# Patient Record
Sex: Male | Born: 1945 | Race: White | Hispanic: No | Marital: Married | Smoking: Former smoker
Health system: Southern US, Community
[De-identification: ages and names within clinical notes are randomized; demographics above are authoritative.]

## PROBLEM LIST (undated history)

## (undated) DIAGNOSIS — I10 Essential (primary) hypertension: Secondary | ICD-10-CM

## (undated) DIAGNOSIS — N400 Enlarged prostate without lower urinary tract symptoms: Secondary | ICD-10-CM

## (undated) HISTORY — PX: KNEE SURGERY: SHX244

## (undated) HISTORY — PX: TONSILLECTOMY: SUR1361

## (undated) HISTORY — PX: DG ELBOW RIGHT COMPLETE (ARMC HX): HXRAD1501

## (undated) HISTORY — PX: JOINT REPLACEMENT: SHX530

---

## 2003-05-01 ENCOUNTER — Ambulatory Visit (HOSPITAL_COMMUNITY): Admission: RE | Admit: 2003-05-01 | Discharge: 2003-05-01 | Payer: Self-pay | Admitting: Gastroenterology

## 2009-02-13 ENCOUNTER — Encounter: Admission: RE | Admit: 2009-02-13 | Discharge: 2009-02-13 | Payer: Self-pay | Admitting: Family Medicine

## 2009-03-12 ENCOUNTER — Encounter: Admission: RE | Admit: 2009-03-12 | Discharge: 2009-03-12 | Payer: Self-pay | Admitting: General Practice

## 2009-12-12 ENCOUNTER — Inpatient Hospital Stay (HOSPITAL_COMMUNITY): Admission: RE | Admit: 2009-12-12 | Discharge: 2009-12-14 | Payer: Self-pay | Admitting: Orthopedic Surgery

## 2010-09-21 LAB — BASIC METABOLIC PANEL
BUN: 11 mg/dL (ref 6–23)
BUN: 12 mg/dL (ref 6–23)
BUN: 16 mg/dL (ref 6–23)
CO2: 29 mEq/L (ref 19–32)
CO2: 31 mEq/L (ref 19–32)
Chloride: 101 mEq/L (ref 96–112)
Chloride: 104 mEq/L (ref 96–112)
Creatinine, Ser: 0.92 mg/dL (ref 0.4–1.5)
Creatinine, Ser: 1.11 mg/dL (ref 0.4–1.5)
GFR calc Af Amer: >60 mL/min (ref >60)
GFR calc non Af Amer: >60 mL/min (ref >60)
Glucose, Bld: 105 mg/dL — ABNORMAL HIGH (ref 70–99)
Potassium: 3.2 mEq/L — ABNORMAL LOW (ref 3.5–5.1)
Potassium: 3.6 mEq/L (ref 3.5–5.1)
Potassium: 3.8 mEq/L (ref 3.5–5.1)
Sodium: 136 mEq/L (ref 135–145)
Sodium: 142 mEq/L (ref 135–145)

## 2010-09-21 LAB — URINALYSIS, ROUTINE W REFLEX MICROSCOPIC
Bilirubin Urine: NEGATIVE
Ketones, ur: NEGATIVE mg/dL
Protein, ur: NEGATIVE mg/dL
Urobilinogen, UA: 0.2 mg/dL (ref 0.0–1.0)

## 2010-09-21 LAB — URINE MICROSCOPIC-ADD ON

## 2010-09-21 LAB — CBC
HCT: 39.9 % (ref 39.0–52.0)
HCT: 40.5 % (ref 39.0–52.0)
HCT: 48.2 % (ref 39.0–52.0)
Hemoglobin: 13.5 g/dL (ref 13.0–17.0)
Hemoglobin: 13.8 g/dL (ref 13.0–17.0)
Hemoglobin: 15.9 g/dL (ref 13.0–17.0)
MCHC: 34 g/dL (ref 30.0–36.0)
MCV: 90.5 fL (ref 78.0–100.0)
MCV: 90.6 fL (ref 78.0–100.0)
Platelets: 187 10*3/uL (ref 150–400)
Platelets: 204 10*3/uL (ref 150–400)
RBC: 4.41 MIL/uL (ref 4.22–5.81)
RBC: 5.32 MIL/uL (ref 4.22–5.81)
RDW: 14.1 % (ref 11.5–15.5)
RDW: 14.1 % (ref 11.5–15.5)
RDW: 14.4 % (ref 11.5–15.5)
WBC: 11.7 10*3/uL — ABNORMAL HIGH (ref 4.0–10.5)
WBC: 7.4 10*3/uL (ref 4.0–10.5)
WBC: 9.9 10*3/uL (ref 4.0–10.5)

## 2010-09-21 LAB — CARDIAC PANEL(CRET KIN+CKTOT+MB+TROPI): Total CK: 125 U/L (ref 7–232)

## 2010-09-21 LAB — SURGICAL PCR SCREEN: Staphylococcus aureus: POSITIVE — AB

## 2010-09-21 LAB — PROTIME-INR
INR: 0.98 (ref 0.00–1.49)
Prothrombin Time: 12.9 seconds (ref 11.6–15.2)

## 2010-09-21 LAB — DIFFERENTIAL
Basophils Absolute: 0 10*3/uL (ref 0.0–0.1)
Eosinophils Absolute: 0.2 10*3/uL (ref 0.0–0.7)
Lymphocytes Relative: 26 % (ref 12–46)
Monocytes Absolute: 0.6 10*3/uL (ref 0.1–1.0)

## 2010-09-21 LAB — APTT: aPTT: 30 seconds (ref 24–37)

## 2010-09-21 LAB — TYPE AND SCREEN

## 2010-11-20 NOTE — Op Note (Signed)
NAME:  Larry Kennedy, Larry Kennedy                            ACCOUNT NO.:  [id number]   MEDICAL RECORD NO.:  [id number]                   PATIENT TYPE:  AMB   LOCATION:  ENDO                                 FACILITY:  [facility]   PHYSICIAN:  Anselmo Rod, M.D.               DATE OF BIRTH:  Sep 17, 1945   DATE OF PROCEDURE:  05/01/2003  DATE OF DISCHARGE:                                 OPERATIVE REPORT   PROCEDURE:  Screening colonoscopy.   ENDOSCOPIST:  Anselmo Rod, M.D.   INSTRUMENT USED:  Olympus video colonoscope.   INDICATIONS FOR PROCEDURE:  A 65 year old white male underwent screening  colonoscopy to rule out colonic polyps, masses, etc.  The patient has  history of chronic constipation and rectal bleeding occasionally.  On  physical examination, he was found to have guaiac positive stools in the  office.   PREPROCEDURE PREPARATION:  Informed consent was procured from the patient.  The patient was fasted for eight hours prior to the procedure and prepped  with a bottle of magnesium citrate and a gallon of GoLYTELY the night prior  to the procedure.   PREPROCEDURE PHYSICAL EXAMINATION:  VITAL SIGNS: Stable.  NECK:  Supple.  CHEST:  Clear to auscultation.  S1 and S2 regular.  ABDOMEN:  Soft with normal bowel sounds.   DESCRIPTION OF PROCEDURE:  The patient was placed in the left lateral  decubitus position and sedated with 90 mg of Demerol and 9 mg of Versed in  slow incremental doses.  Once the patient was adequately sedated and  maintained on low flow oxygen and continuous cardiac monitoring, the Olympus  video colonoscope was advanced from the rectum to the cecum.  The  appendiceal orifice and ileocecal valve were clearly visualized and  photographed.  No masses, polyps, erosions, ulcerations, or diverticula were  seen.  Small internal hemorrhoids were appreciated on retroflexion in the  rectum. The patient tolerated the procedure well without complications.   IMPRESSION:  1.  Normal colonoscopy up to the cecum except for small internal hemorrhoids.  2. No masses, polyps, or diverticulosis seen.    RECOMMENDATIONS:  A high fiber diet with liberal fluid intake has been  advised.  Repeat CRC screening is recommended in the next 10 years unless  the patient develops any abnormal symptoms in the interim.  Outpatient  follow-up in the next two to three weeks for repeat guaiac.                                               Anselmo Rod, M.D.    JNM/MEDQ  D:  05/01/2003  T:  05/01/2003  Job:  [id number]   cc:   Marjory Lies, M.D.  [address]  [place]  [place] [address]  Fax:  [phone]  

## 2013-12-30 ENCOUNTER — Encounter (HOSPITAL_COMMUNITY): Payer: Self-pay | Admitting: Emergency Medicine

## 2013-12-30 ENCOUNTER — Emergency Department (HOSPITAL_COMMUNITY): Payer: Medicare Other

## 2013-12-30 ENCOUNTER — Emergency Department (HOSPITAL_COMMUNITY)
Admission: EM | Admit: 2013-12-30 | Discharge: 2013-12-30 | Disposition: A | Discharge status: Home / Self Care | Payer: Medicare Other | Attending: Emergency Medicine | Admitting: Emergency Medicine

## 2013-12-30 DIAGNOSIS — K560 Paralytic ileus: Secondary | ICD-10-CM | POA: Insufficient documentation

## 2013-12-30 DIAGNOSIS — R1032 Left lower quadrant pain: Secondary | ICD-10-CM

## 2013-12-30 DIAGNOSIS — K567 Ileus, unspecified: Secondary | ICD-10-CM

## 2013-12-30 HISTORY — DX: Essential (primary) hypertension: I10

## 2013-12-30 LAB — CBC WITH DIFFERENTIAL/PLATELET
BASOS ABS: 0 10*3/uL (ref 0.0–0.1)
BASOS PCT: 0 % (ref 0–1)
EOS PCT: 1 % (ref 0–5)
Eosinophils Absolute: 0 10*3/uL (ref 0.0–0.7)
HEMATOCRIT: 44 % (ref 39.0–52.0)
HEMOGLOBIN: 16 g/dL (ref 13.0–17.0)
Lymphocytes Relative: 11 % — ABNORMAL LOW (ref 12–46)
Lymphs Abs: 1 10*3/uL (ref 0.7–4.0)
MCH: 31.1 pg (ref 26.0–34.0)
MCHC: 36.4 g/dL — AB (ref 30.0–36.0)
MCV: 85.4 fL (ref 78.0–100.0)
MONO ABS: 0.3 10*3/uL (ref 0.1–1.0)
MONOS PCT: 4 % (ref 3–12)
NEUTROS ABS: 7.4 10*3/uL (ref 1.7–7.7)
NEUTROS PCT: 84 % — AB (ref 43–77)
PLATELETS: 194 10*3/uL (ref 150–400)
RBC: 5.15 MIL/uL (ref 4.22–5.81)
RDW: 12.8 % (ref 11.5–15.5)
WBC: 8.7 10*3/uL (ref 4.0–10.5)

## 2013-12-30 LAB — I-STAT CG4 LACTIC ACID, ED
LACTIC ACID, VENOUS: 3.17 mmol/L — AB (ref 0.5–2.2)
Lactic Acid, Venous: 2.13 mmol/L (ref 0.5–2.2)

## 2013-12-30 LAB — COMPREHENSIVE METABOLIC PANEL
ALBUMIN: 3.3 g/dL — AB (ref 3.5–5.2)
ALT: 27 U/L (ref 0–53)
AST: 22 U/L (ref 0–37)
Alkaline Phosphatase: 65 U/L (ref 39–117)
BILIRUBIN TOTAL: 0.5 mg/dL (ref 0.3–1.2)
BUN: 15 mg/dL (ref 6–23)
CO2: 26 mEq/L (ref 19–32)
CREATININE: 1.04 mg/dL (ref 0.50–1.35)
Calcium: 9.2 mg/dL (ref 8.4–10.5)
Chloride: 98 mEq/L (ref 96–112)
GFR calc Af Amer: 83 mL/min — ABNORMAL LOW (ref >90)
GFR calc non Af Amer: 72 mL/min — ABNORMAL LOW (ref >90)
GLUCOSE: 177 mg/dL — AB (ref 70–99)
POTASSIUM: 3.6 meq/L — AB (ref 3.7–5.3)
Sodium: 140 mEq/L (ref 137–147)
Total Protein: 7.2 g/dL (ref 6.0–8.3)

## 2013-12-30 LAB — URINALYSIS, ROUTINE W REFLEX MICROSCOPIC
BILIRUBIN URINE: NEGATIVE
GLUCOSE, UA: NEGATIVE mg/dL
Hgb urine dipstick: NEGATIVE
KETONES UR: NEGATIVE mg/dL
Leukocytes, UA: NEGATIVE
NITRITE: NEGATIVE
Protein, ur: NEGATIVE mg/dL
Specific Gravity, Urine: >1.046 — ABNORMAL HIGH (ref 1.005–1.030)
Urobilinogen, UA: 0.2 mg/dL (ref 0.0–1.0)
pH: 7 (ref 5.0–8.0)

## 2013-12-30 LAB — I-STAT TROPONIN, ED: Troponin i, poc: 0 ng/mL (ref 0.00–0.08)

## 2013-12-30 LAB — LIPASE, BLOOD: Lipase: 35 U/L (ref 11–59)

## 2013-12-30 MED ORDER — MORPHINE SULFATE 4 MG/ML IJ SOLN
4.0000 mg | Freq: Once | INTRAMUSCULAR | Status: AC
  Administered 2013-12-30: 4 mg via INTRAVENOUS
  Filled 2013-12-30: qty 1

## 2013-12-30 MED ORDER — HYDROMORPHONE HCL PF 1 MG/ML IJ SOLN
1.0000 mg | Freq: Once | INTRAMUSCULAR | Status: AC
  Administered 2013-12-30: 1 mg via INTRAVENOUS
  Filled 2013-12-30: qty 1

## 2013-12-30 MED ORDER — IOHEXOL 300 MG/ML  SOLN
100.0000 mL | Freq: Once | INTRAMUSCULAR | Status: AC | PRN
  Administered 2013-12-30: 100 mL via INTRAVENOUS

## 2013-12-30 MED ORDER — OXYCODONE-ACETAMINOPHEN 5-325 MG PO TABS: 1.0000 | ORAL_TABLET | ORAL | Status: DC | PRN

## 2013-12-30 MED ORDER — ONDANSETRON HCL 4 MG PO TABS: 4.0000 mg | ORAL_TABLET | Freq: Four times a day (QID) | ORAL | Status: DC

## 2013-12-30 MED ORDER — IOHEXOL 300 MG/ML  SOLN
50.0000 mL | Freq: Once | INTRAMUSCULAR | Status: AC | PRN
  Administered 2013-12-30: 50 mL via ORAL

## 2013-12-30 MED ORDER — SODIUM CHLORIDE 0.9 % IV BOLUS (SEPSIS)
1000.0000 mL | Freq: Once | INTRAVENOUS | Status: AC
  Administered 2013-12-30: 1000 mL via INTRAVENOUS

## 2013-12-30 NOTE — ED Notes (Signed)
Larry Kennedy, a P.A. [facility] phones to tell us they are sending pt. Here via p.o.v. For abd. Pain.  He rec'd. Injections of Toradol 30mg  and Phenergan 25mg  I.M.

## 2013-12-30 NOTE — Discharge Instructions (Signed)
Take the prescribed medication as directed for symptom control.  Pain meds may cause constipation so may need to use stool softener (colace, miralax, dulcolax) temporarily to help regulate bowel movements. Follow-up with Dr. Collene Mares regarding renal cysts and questionable mass next to your colon that we discussed.  I have attached copies of lab and imaging reports for physician review. Return to the ED for new or worsening symptoms-- severe/uncontrollable/changing pain, high fever, severe vomiting, etc.

## 2013-12-30 NOTE — ED Notes (Signed)
Pt aware urine sample is needed, urinal given 

## 2013-12-30 NOTE — ED Notes (Signed)
Tech at bedside drawing lactic acid.

## 2013-12-30 NOTE — ED Provider Notes (Signed)
CSN: [id number]     Arrival date & time 12/30/13  1616 History   First MD Initiated Contact with Patient 12/30/13 1620     Chief Complaint  Patient presents with  . Abdominal Pain     (Consider location/radiation/quality/duration/timing/severity/associated sxs/prior Treatment) Patient is a 68 y.o. male presenting with abdominal pain. The history is provided by the patient and medical records.  Abdominal Pain Associated symptoms: diarrhea    This is a 68 year old male presenting to the ED from urgent care for abdominal pain. Patient states pain began early this morning and has progressively worsened throughout the day today. Pain is localized to his left lower quadrant, described as an intense stabbing pain. He denies associated nausea or vomiting.  He does admit that his stools have been loose lately, but denies melena or hematochezia. Last occurrence was yesterday.  He denies recent travel or antibiotic use.  He denies any fever or chills. No urinary symptoms. There are history of kidney stones. Patient has had a colonoscopy twice in the past, most recent one in January 2015 with a few polyps removed but no history of diverticulosis.  No prior abdominal surgeries.  Patient states since arriving in the ED, he has developed somewhat of a burning sensation in his upper abdomen.  Denies chest pain, SOB, palpitations, dizziness, weakness.  No prior cardiac hx.  VS stable on arrival. GI-- [facility]  No past medical history on file. No past surgical history on file. No family history on file. History  Substance Use Topics  . Smoking status: Not on file  . Smokeless tobacco: Not on file  . Alcohol Use: Not on file    Review of Systems  Gastrointestinal: Positive for abdominal pain and diarrhea.  All other systems reviewed and are negative.     Allergies  Review of patient's allergies indicates not on file.  Home Medications   Prior to Admission medications   Not on File   BP 123/66   Pulse 57  Temp(Src) 97.6 F (36.4 C) (Oral)  Resp 18  SpO2 99%  Physical Exam  Nursing note and vitals reviewed. Constitutional: He is oriented to person, place, and time. He appears well-developed and well-nourished. No distress.  Uncomfortable appearing  HENT:  Head: Normocephalic and atraumatic.  Mouth/Throat: Oropharynx is clear and moist.  Eyes: Conjunctivae and EOM are normal. Pupils are equal, round, and reactive to light.  Neck: Normal range of motion. Neck supple.  Cardiovascular: Normal rate, regular rhythm and normal heart sounds.   Pulmonary/Chest: Effort normal and breath sounds normal. No respiratory distress. He has no wheezes.  Abdominal: Soft. Bowel sounds are normal. There is tenderness in the left lower quadrant. There is guarding. There is no rigidity and no CVA tenderness.  Abdomen soft, nondistended, tenderness and left lower quadrant with voluntary guarding, no rebound or peritoneal signs  Musculoskeletal: Normal range of motion.  Neurological: He is alert and oriented to person, place, and time.  Skin: Skin is warm and dry. He is not diaphoretic.  Psychiatric: He has a normal mood and affect.    ED Course  Procedures (including critical care time) Labs Review Labs Reviewed  CBC WITH DIFFERENTIAL - Abnormal; Notable for the following:    MCHC 36.4 (*)    Neutrophils Relative % 84 (*)    Lymphocytes Relative 11 (*)    All other components within normal limits  COMPREHENSIVE METABOLIC PANEL - Abnormal; Notable for the following:    Potassium 3.6 (*)  Glucose, Bld 177 (*)    Albumin 3.3 (*)    GFR calc non Af Amer 72 (*)    GFR calc Af Amer 83 (*)    All other components within normal limits  URINALYSIS, ROUTINE W REFLEX MICROSCOPIC - Abnormal; Notable for the following:    Specific Gravity, Urine >1.046 (*)    All other components within normal limits  I-STAT CG4 LACTIC ACID, ED - Abnormal; Notable for the following:    Lactic Acid, Venous 3.17 (*)     All other components within normal limits  LIPASE, BLOOD  I-STAT TROPOININ, ED  I-STAT CG4 LACTIC ACID, ED    Imaging Review Ct Abdomen Pelvis W Contrast  12/30/2013   CLINICAL DATA:  Left lower quadrant pain.  Sudden onset.  EXAM: CT ABDOMEN AND PELVIS WITH CONTRAST  TECHNIQUE: Multidetector CT imaging of the abdomen and pelvis was performed using the standard protocol following bolus administration of intravenous contrast.  CONTRAST:  2mL OMNIPAQUE IOHEXOL 300 MG/ML SOLN, 116mL OMNIPAQUE IOHEXOL 300 MG/ML SOLN  COMPARISON:  None.  FINDINGS: Visualization of the lower thorax demonstrates minimal dependent atelectasis. Normal heart size.  Liver is normal in size and contour without focal hepatic lesion identified. Mild central intrahepatic biliary ductal dilatation. The common bile duct is dilated measuring up to 11 mm. Gallbladder is grossly unremarkable. Portal vein is patent. The spleen, pancreas bilateral adrenal glands are unremarkable. Kidneys enhance symmetrically with contrast. There is a 1.3 cm low-attenuation lesion within the superior pole of the right kidney (image 29; series 2). Suggestion of possible internal septations.  Normal caliber abdominal aorta. No retroperitoneal lymphadenopathy. There is a 1.4 cm soft tissue mass adjacent to sigmoid colon (image 59; series 2), potentially representing an enlarged mesenteric lymph node. Urinary bladder is unremarkable. Central dystrophic calcifications in the prostate  The colon is nondistended. The appendix is normal. There a few prominent loops of small bowel within the left upper quadrant measuring up to 3.5 cm, slightly above that of the upper limits of normal. These taper appropriately within the central abdomen. No free fluid or free intraperitoneal air.  No aggressive or acute appearing osseous lesions. Lower lumbar spine degenerative change.  IMPRESSION: 1. There is a 1.4 cm soft tissue mass adjacent to the sigmoid colon which is  nonspecific however may be an enlarged mesenteric lymph node. In the absence of known malignancy, recommend follow-up CT in 3 months. Recommend further evaluation of the adjacent colon with colonoscopy if not previously performed. 2. Cystic lesion within superior aspect of the right kidney with possible internal septations. Recommend correlation with MRI or pre and postcontrast CT in the non acute setting for definitive characterization and to exclude septal enhancement. 3. Common bile duct measures up to 1.2 cm with mild central intrahepatic biliary ductal dilatation. This appears to taper normally near the level of the distal common bile duct. Recommend correlation with laboratory analysis. 4. There are a few mildly dilated loops of small bowel within left upper quadrant which appear to taper normally potentially secondary to focal enteritis or ileus.   Electronically Signed   By: Lovey Newcomer M.D.   On: 12/30/2013 18:52     EKG Interpretation None      MDM   Final diagnoses:  Ileus  Left lower quadrant pain   68 year old male with sudden onset of severe abdominal pain worsening throughout the day today.  On exam patient is afebrile and overall nontoxic appearing, although he does appear quite uncomfortable. He  has tenderness in his left lower quadrant with voluntary guarding but no peritoneal signs. Concern for acute surgical abdomen including diverticulitis, ischemic bowel. We'll plan for basic labs, lactic acid, U/a and CT scan for further eval.  Pain meds given.  EKG NSR without ischemic change.  Trop negative.  Labs as above-- no leukocytosis but lactate mildly elevated at 3.17.  IVFB given.  CT abdomen/pelvis with mildly dilated loops of small bowel consistent with ileus.  Multiple incidental findings also noted-- 1.4cm mass adjacent to sigmoid colon (questionable reactive lymph node), cystic lesion in right kidney, mild central biliary ductal dilatation.  Pts LFT's and bili WNL.  After pain  meds, pt has significantly improved.  Pt has tolerated PO solids and liquids in the ED without difficulty.  He has ambulated to and from the bathroom as well.  Repeat lactic acid after fluids WNL.  Pt remains afebrile and non-toxic appearing, VS stable, states he feels well enough to return home.  Encouraged small meals for the next few days, plenty of fluids.  Incidental findings on CT scan were discussed at length with patient and wife who both acknowledged understanding-- will FU with GI (Dr. Collene Mares) regarding these findings to have OP scans arranged.  Rx percocet and zofran for home-- recommended adding stool softener if needed for constipation issues.  Discussed plan with patient, he/she acknowledged understanding and agreed with plan of care.  Return precautions given for new or worsening symptoms.  Discussed with Dr. Venora Maples who agrees with assessment and plan of care.  Larene Pickett, PA-C 12/30/13 2340

## 2013-12-30 NOTE — ED Notes (Signed)
Pt sent from urgent care with c/o sudden onset of severe abdominal pain since this morning.

## 2013-12-31 NOTE — ED Provider Notes (Addendum)
Medical screening examination/treatment/procedure(s) were performed by non-physician practitioner and as supervising physician I was immediately available for consultation/collaboration.  ECG interpretation   Date: 12/31/2013  Rate: 58  Rhythm: normal sinus rhythm  QRS Axis: normal  Intervals: normal  ST/T Wave abnormalities: Nonspecific ST and T wave changes  Conduction Disutrbances: none  Narrative Interpretation:   Old EKG Reviewed: No significant changes noted       Hoy Morn, MD 12/31/13 [place], MD 12/31/13 [phone]

## 2014-01-24 ENCOUNTER — Other Ambulatory Visit: Payer: Self-pay | Admitting: Urology

## 2014-01-24 DIAGNOSIS — N281 Cyst of kidney, acquired: Secondary | ICD-10-CM

## 2014-02-04 ENCOUNTER — Ambulatory Visit
Admission: RE | Admit: 2014-02-04 | Discharge: 2014-02-04 | Disposition: A | Discharge status: Home / Self Care | Payer: Medicare Other | Source: Ambulatory Visit | Attending: Urology | Admitting: Urology

## 2014-02-04 DIAGNOSIS — N281 Cyst of kidney, acquired: Secondary | ICD-10-CM

## 2014-02-04 MED ORDER — GADOBENATE DIMEGLUMINE 529 MG/ML IV SOLN
20.0000 mL | Freq: Once | INTRAVENOUS | Status: AC | PRN
  Administered 2014-02-04: 20 mL via INTRAVENOUS

## 2017-01-14 ENCOUNTER — Ambulatory Visit: Payer: Self-pay | Admitting: Physician Assistant

## 2017-02-02 NOTE — Pre-Procedure Instructions (Signed)
[facility]  02/02/2017      [facility] [id number], [place] [address] [place] [address] Phone: [phone] Fax: [phone] [place], [place] [facility] [address] [place] [address] Phone: [phone] Fax: [phone]    Your procedure is scheduled on :  Wednesday, August 8th   Report to [facility] Admitting at 6:30 AM            (posted surgery time 8:30 - 11:30AM)   Call this number if you have problems the morning of surgery:  [phone], for any other questions, call [phone] Mon-Fri from 8a-4p   Remember:  Do not eat food or drink liquids after midnight Tuesday.              4-5 days prior to surgery, ONLY STOP TAKING any vitamins, herbal supplements, anti-inflammatories   Take these medicines the morning of surgery with A SIP OF WATER : Norvasc, Coreg   Do not wear jewelry - no rings or watches.  Do not wear lotions, colognes or deoderant.             Men may shave face and neck.   Do not bring valuables to the hospital.  [facility] is not responsible for any belongings or valuables.  Contacts, dentures or bridgework may not be worn into surgery.  Leave your suitcase in the car.  After surgery it may be brought to your room.  For patients admitted to the hospital, discharge time will be determined by your treatment team.  Please read over the following fact sheets that you were given. Pain Booklet, MRSA Information and Surgical Site Infection Prevention      [facility]- Preparing For Surgery  Before surgery, you can play an important role. Because skin is not sterile, your skin needs to be as free of germs as possible. You can reduce the number of germs on your skin by washing with CHG (chlorahexidine gluconate) Soap before surgery.  CHG is an antiseptic cleaner which kills germs and bonds with the skin to continue killing germs even after  washing.  Please do not use if you have an allergy to CHG or antibacterial soaps. If your skin becomes reddened/irritated stop using the CHG.  Do not shave (including legs and underarms) for at least 48 hours prior to first CHG shower. It is OK to shave your face.  Please follow these instructions carefully.   1. Shower the NIGHT BEFORE SURGERY and the MORNING OF SURGERY with CHG.   2. If you chose to wash your hair, wash your hair first as usual with your normal shampoo.  3. After you shampoo, rinse your hair and body thoroughly to remove the shampoo.  4. Use CHG as you would any other liquid soap. You can apply CHG directly to the skin and wash gently with a scrungie or a clean washcloth.   5. Apply the CHG Soap to your body ONLY FROM THE NECK DOWN.  Do not use on open wounds or open sores. Avoid contact with your eyes, ears, mouth and genitals (private parts). Wash genitals (private parts) with your normal soap.  6. Wash thoroughly, paying special attention to the area where your surgery will be performed.  7. Thoroughly rinse your body with warm water from the neck down.  8. DO NOT shower/wash with your normal soap after using and rinsing off the CHG  Soap.  9. Pat yourself dry with a CLEAN TOWEL.   10. Wear CLEAN PAJAMAS   11. Place CLEAN SHEETS on your bed the night of your first shower and DO NOT SLEEP WITH PETS.    Day of Surgery: Do not apply any deodorants/lotions. Please wear clean clothes to the hospital/surgery center.

## 2017-02-03 ENCOUNTER — Encounter (HOSPITAL_COMMUNITY): Payer: Self-pay

## 2017-02-03 ENCOUNTER — Encounter (HOSPITAL_COMMUNITY)
Admission: RE | Admit: 2017-02-03 | Discharge: 2017-02-03 | Disposition: A | Discharge status: Home / Self Care | Payer: Medicare Other | Source: Ambulatory Visit | Attending: Orthopedic Surgery | Admitting: Orthopedic Surgery

## 2017-02-03 DIAGNOSIS — Z01818 Encounter for other preprocedural examination: Secondary | ICD-10-CM | POA: Insufficient documentation

## 2017-02-03 DIAGNOSIS — I10 Essential (primary) hypertension: Secondary | ICD-10-CM | POA: Diagnosis not present

## 2017-02-03 HISTORY — DX: Benign prostatic hyperplasia without lower urinary tract symptoms: N40.0

## 2017-02-03 LAB — BASIC METABOLIC PANEL
ANION GAP: 8 (ref 5–15)
BUN: 13 mg/dL (ref 6–20)
CHLORIDE: 106 mmol/L (ref 101–111)
CO2: 25 mmol/L (ref 22–32)
Calcium: 8.8 mg/dL — ABNORMAL LOW (ref 8.9–10.3)
Creatinine, Ser: 0.97 mg/dL (ref 0.61–1.24)
GFR calc Af Amer: >60 mL/min (ref >60)
GLUCOSE: 140 mg/dL — AB (ref 65–99)
POTASSIUM: 3.6 mmol/L (ref 3.5–5.1)
Sodium: 139 mmol/L (ref 135–145)

## 2017-02-03 LAB — CBC
HEMATOCRIT: 44.4 % (ref 39.0–52.0)
Hemoglobin: 15.2 g/dL (ref 13.0–17.0)
MCH: 30.1 pg (ref 26.0–34.0)
MCHC: 34.2 g/dL (ref 30.0–36.0)
MCV: 87.9 fL (ref 78.0–100.0)
Platelets: 183 10*3/uL (ref 150–400)
RBC: 5.05 MIL/uL (ref 4.22–5.81)
RDW: 13.6 % (ref 11.5–15.5)
WBC: 6.7 10*3/uL (ref 4.0–10.5)

## 2017-02-03 LAB — SURGICAL PCR SCREEN
MRSA, PCR: NEGATIVE
Staphylococcus aureus: NEGATIVE

## 2017-02-03 NOTE — Progress Notes (Signed)
   02/03/17 0903  OBSTRUCTIVE SLEEP APNEA  Have you ever been diagnosed with sleep apnea through a sleep study? No  Do you snore loudly (loud enough to be heard through closed doors)?  1  Do you often feel tired, fatigued, or sleepy during the daytime (such as falling asleep during driving or talking to someone)? 0  Has anyone observed you stop breathing during your sleep? 0  Do you have, or are you being treated for high blood pressure? 1  BMI more than 35 kg/m2? 0  Age > 50 (1-yes) 1  Neck circumference greater than:Male 16 inches or larger, Male 17inches or larger? 1  Male Gender (Yes=1) 1  Obstructive Sleep Apnea Score 5  Score 5 or greater  Results sent to PCP

## 2017-02-03 NOTE — Progress Notes (Addendum)
Pre op clearance note in Epic from [facility] Requesting EKG tracing from the same. PCP is Dr. Percival Spanish  LOV 01/2017 Denies any cardiac issues, no murmur, stroke, cp.  No cardiac tests done

## 2017-02-09 ENCOUNTER — Inpatient Hospital Stay (HOSPITAL_COMMUNITY)
Admission: AD | Admit: 2017-02-09 | Discharge: 2017-02-11 | DRG: 520 | Disposition: A | Discharge status: Home / Self Care | Payer: Medicare Other | Source: Ambulatory Visit | Attending: Orthopedic Surgery | Admitting: Orthopedic Surgery

## 2017-02-09 ENCOUNTER — Inpatient Hospital Stay (HOSPITAL_COMMUNITY): Payer: Medicare Other | Admitting: Certified Registered"

## 2017-02-09 ENCOUNTER — Inpatient Hospital Stay (HOSPITAL_COMMUNITY): Payer: Medicare Other

## 2017-02-09 ENCOUNTER — Encounter (HOSPITAL_COMMUNITY): Payer: Self-pay | Admitting: Orthopedic Surgery

## 2017-02-09 ENCOUNTER — Encounter (HOSPITAL_COMMUNITY)
Admission: AD | Disposition: A | Discharge status: Home / Self Care | Payer: Self-pay | Source: Ambulatory Visit | Attending: Orthopedic Surgery

## 2017-02-09 DIAGNOSIS — I10 Essential (primary) hypertension: Secondary | ICD-10-CM | POA: Diagnosis present

## 2017-02-09 DIAGNOSIS — Z79899 Other long term (current) drug therapy: Secondary | ICD-10-CM

## 2017-02-09 DIAGNOSIS — M5127 Other intervertebral disc displacement, lumbosacral region: Secondary | ICD-10-CM | POA: Diagnosis present

## 2017-02-09 DIAGNOSIS — M5126 Other intervertebral disc displacement, lumbar region: Secondary | ICD-10-CM | POA: Diagnosis present

## 2017-02-09 DIAGNOSIS — Z9889 Other specified postprocedural states: Secondary | ICD-10-CM

## 2017-02-09 DIAGNOSIS — M4687 Other specified inflammatory spondylopathies, lumbosacral region: Secondary | ICD-10-CM | POA: Diagnosis not present

## 2017-02-09 DIAGNOSIS — Z419 Encounter for procedure for purposes other than remedying health state, unspecified: Secondary | ICD-10-CM

## 2017-02-09 DIAGNOSIS — N400 Enlarged prostate without lower urinary tract symptoms: Secondary | ICD-10-CM | POA: Diagnosis present

## 2017-02-09 DIAGNOSIS — Z87891 Personal history of nicotine dependence: Secondary | ICD-10-CM

## 2017-02-09 DIAGNOSIS — G8929 Other chronic pain: Secondary | ICD-10-CM | POA: Diagnosis not present

## 2017-02-09 DIAGNOSIS — M48062 Spinal stenosis, lumbar region with neurogenic claudication: Principal | ICD-10-CM | POA: Diagnosis present

## 2017-02-09 DIAGNOSIS — Z8249 Family history of ischemic heart disease and other diseases of the circulatory system: Secondary | ICD-10-CM | POA: Diagnosis not present

## 2017-02-09 DIAGNOSIS — M5136 Other intervertebral disc degeneration, lumbar region: Secondary | ICD-10-CM | POA: Diagnosis present

## 2017-02-09 HISTORY — PX: LUMBAR LAMINECTOMY/DECOMPRESSION MICRODISCECTOMY: SHX5026

## 2017-02-09 SURGERY — LUMBAR LAMINECTOMY/DECOMPRESSION MICRODISCECTOMY 3 LEVELS
Anesthesia: General | Site: Spine Lumbar

## 2017-02-09 MED ORDER — ONDANSETRON 4 MG PO TBDP: 4.0000 mg | ORAL_TABLET | Freq: Three times a day (TID) | ORAL | 0 refills | Status: DC | PRN

## 2017-02-09 MED ORDER — TAMSULOSIN HCL 0.4 MG PO CAPS
0.4000 mg | ORAL_CAPSULE | Freq: Every evening | ORAL | Status: DC
  Administered 2017-02-09: 0.4 mg via ORAL
  Filled 2017-02-09: qty 1

## 2017-02-09 MED ORDER — BUPIVACAINE-EPINEPHRINE (PF) 0.25% -1:200000 IJ SOLN
INTRAMUSCULAR | Status: AC
  Filled 2017-02-09: qty 30

## 2017-02-09 MED ORDER — OXYCODONE-ACETAMINOPHEN 10-325 MG PO TABS: 1.0000 | ORAL_TABLET | ORAL | 0 refills | Status: AC | PRN

## 2017-02-09 MED ORDER — SODIUM CHLORIDE 0.9 % IV SOLN: 250.0000 mL | INTRAVENOUS | Status: DC

## 2017-02-09 MED ORDER — BUPIVACAINE-EPINEPHRINE 0.25% -1:200000 IJ SOLN
INTRAMUSCULAR | Status: DC | PRN
  Administered 2017-02-09: 10 mL

## 2017-02-09 MED ORDER — PHENYLEPHRINE 40 MCG/ML (10ML) SYRINGE FOR IV PUSH (FOR BLOOD PRESSURE SUPPORT)
PREFILLED_SYRINGE | INTRAVENOUS | Status: DC | PRN
  Administered 2017-02-09: 80 ug via INTRAVENOUS
  Administered 2017-02-09: 160 ug via INTRAVENOUS
  Administered 2017-02-09 (×2): 80 ug via INTRAVENOUS

## 2017-02-09 MED ORDER — OXYCODONE HCL 5 MG PO TABS
5.0000 mg | ORAL_TABLET | ORAL | Status: DC | PRN
  Administered 2017-02-09 (×2): 10 mg via ORAL
  Administered 2017-02-10: 5 mg via ORAL
  Administered 2017-02-10 (×3): 10 mg via ORAL
  Filled 2017-02-09 (×4): qty 2
  Filled 2017-02-09: qty 1
  Filled 2017-02-09: qty 2

## 2017-02-09 MED ORDER — LIDOCAINE 2% (20 MG/ML) 5 ML SYRINGE
INTRAMUSCULAR | Status: AC
  Filled 2017-02-09: qty 5

## 2017-02-09 MED ORDER — ONDANSETRON HCL 4 MG PO TABS: 4.0000 mg | ORAL_TABLET | Freq: Four times a day (QID) | ORAL | Status: DC | PRN

## 2017-02-09 MED ORDER — LACTATED RINGERS IV SOLN
INTRAVENOUS | Status: DC
  Administered 2017-02-09 (×3): via INTRAVENOUS

## 2017-02-09 MED ORDER — ROCURONIUM BROMIDE 10 MG/ML (PF) SYRINGE
PREFILLED_SYRINGE | INTRAVENOUS | Status: AC
  Filled 2017-02-09: qty 5

## 2017-02-09 MED ORDER — MIDAZOLAM HCL 2 MG/2ML IJ SOLN
INTRAMUSCULAR | Status: AC
  Filled 2017-02-09: qty 2

## 2017-02-09 MED ORDER — FENTANYL CITRATE (PF) 250 MCG/5ML IJ SOLN
INTRAMUSCULAR | Status: DC | PRN
  Administered 2017-02-09 (×2): 50 ug via INTRAVENOUS
  Administered 2017-02-09 (×2): 25 ug via INTRAVENOUS

## 2017-02-09 MED ORDER — THROMBIN 20000 UNITS EX SOLR
CUTANEOUS | Status: AC
  Filled 2017-02-09: qty 20000

## 2017-02-09 MED ORDER — ONDANSETRON HCL 4 MG/2ML IJ SOLN
INTRAMUSCULAR | Status: AC
  Filled 2017-02-09: qty 2

## 2017-02-09 MED ORDER — ALBUMIN HUMAN 5 % IV SOLN
INTRAVENOUS | Status: DC | PRN
  Administered 2017-02-09: 13:00:00 via INTRAVENOUS

## 2017-02-09 MED ORDER — PROPOFOL 10 MG/ML IV BOLUS
INTRAVENOUS | Status: AC
  Filled 2017-02-09: qty 20

## 2017-02-09 MED ORDER — ATORVASTATIN CALCIUM 20 MG PO TABS
40.0000 mg | ORAL_TABLET | Freq: Every evening | ORAL | Status: DC
  Administered 2017-02-09: 40 mg via ORAL
  Filled 2017-02-09: qty 2

## 2017-02-09 MED ORDER — THROMBIN 20000 UNITS EX KIT
PACK | CUTANEOUS | Status: DC | PRN
  Administered 2017-02-09: 20 mL via TOPICAL

## 2017-02-09 MED ORDER — FLUTICASONE PROPIONATE 50 MCG/ACT NA SUSP: 1.0000 | Freq: Every day | NASAL | Status: DC | PRN

## 2017-02-09 MED ORDER — MORPHINE SULFATE (PF) 4 MG/ML IV SOLN: 1.0000 mg | INTRAVENOUS | Status: DC | PRN

## 2017-02-09 MED ORDER — PHENYLEPHRINE 40 MCG/ML (10ML) SYRINGE FOR IV PUSH (FOR BLOOD PRESSURE SUPPORT)
PREFILLED_SYRINGE | INTRAVENOUS | Status: AC
  Filled 2017-02-09: qty 10

## 2017-02-09 MED ORDER — BUPIVACAINE-EPINEPHRINE 0.25% -1:200000 IJ SOLN
INTRAMUSCULAR | Status: AC
  Filled 2017-02-09: qty 1

## 2017-02-09 MED ORDER — CEFAZOLIN SODIUM 1 G IJ SOLR
INTRAMUSCULAR | Status: AC
  Filled 2017-02-09: qty 20

## 2017-02-09 MED ORDER — ROCURONIUM BROMIDE 10 MG/ML (PF) SYRINGE
PREFILLED_SYRINGE | INTRAVENOUS | Status: DC | PRN
  Administered 2017-02-09: 10 mg via INTRAVENOUS
  Administered 2017-02-09: 60 mg via INTRAVENOUS
  Administered 2017-02-09 (×4): 20 mg via INTRAVENOUS

## 2017-02-09 MED ORDER — CARVEDILOL 6.25 MG PO TABS
18.7500 mg | ORAL_TABLET | Freq: Two times a day (BID) | ORAL | Status: DC
  Administered 2017-02-10: 18.75 mg via ORAL
  Filled 2017-02-09 (×2): qty 3

## 2017-02-09 MED ORDER — MENTHOL 3 MG MT LOZG: 1.0000 | LOZENGE | OROMUCOSAL | Status: DC | PRN

## 2017-02-09 MED ORDER — PHENOL 1.4 % MT LIQD: 1.0000 | OROMUCOSAL | Status: DC | PRN

## 2017-02-09 MED ORDER — MAGNESIUM CITRATE PO SOLN: 1.0000 | Freq: Once | ORAL | Status: DC | PRN

## 2017-02-09 MED ORDER — LIDOCAINE 2% (20 MG/ML) 5 ML SYRINGE
INTRAMUSCULAR | Status: DC | PRN
  Administered 2017-02-09: 100 mg via INTRAVENOUS

## 2017-02-09 MED ORDER — METOCLOPRAMIDE HCL 5 MG/ML IJ SOLN: 10.0000 mg | Freq: Once | INTRAMUSCULAR | Status: DC | PRN

## 2017-02-09 MED ORDER — DEXAMETHASONE SODIUM PHOSPHATE 10 MG/ML IJ SOLN
INTRAMUSCULAR | Status: DC | PRN
  Administered 2017-02-09: 10 mg via INTRAVENOUS

## 2017-02-09 MED ORDER — FENTANYL CITRATE (PF) 250 MCG/5ML IJ SOLN
INTRAMUSCULAR | Status: AC
  Filled 2017-02-09: qty 5

## 2017-02-09 MED ORDER — CYCLOSPORINE 0.05 % OP EMUL
1.0000 [drp] | Freq: Two times a day (BID) | OPHTHALMIC | Status: DC
  Administered 2017-02-09 – 2017-02-10 (×2): 1 [drp] via OPHTHALMIC
  Filled 2017-02-09 (×4): qty 1

## 2017-02-09 MED ORDER — MEPERIDINE HCL 25 MG/ML IJ SOLN: 6.2500 mg | INTRAMUSCULAR | Status: DC | PRN

## 2017-02-09 MED ORDER — SODIUM CHLORIDE 0.9% FLUSH: 3.0000 mL | INTRAVENOUS | Status: DC | PRN

## 2017-02-09 MED ORDER — METHOCARBAMOL 500 MG PO TABS
500.0000 mg | ORAL_TABLET | Freq: Four times a day (QID) | ORAL | Status: DC | PRN
  Administered 2017-02-09 – 2017-02-10 (×3): 500 mg via ORAL
  Filled 2017-02-09 (×3): qty 1

## 2017-02-09 MED ORDER — DEXAMETHASONE SODIUM PHOSPHATE 10 MG/ML IJ SOLN
INTRAMUSCULAR | Status: AC
  Filled 2017-02-09: qty 1

## 2017-02-09 MED ORDER — AMLODIPINE BESYLATE 5 MG PO TABS
5.0000 mg | ORAL_TABLET | Freq: Every day | ORAL | Status: DC
  Administered 2017-02-10: 5 mg via ORAL
  Filled 2017-02-09 (×2): qty 1

## 2017-02-09 MED ORDER — SUGAMMADEX SODIUM 200 MG/2ML IV SOLN
INTRAVENOUS | Status: DC | PRN
  Administered 2017-02-09: 200 mg via INTRAVENOUS

## 2017-02-09 MED ORDER — HEMOSTATIC AGENTS (NO CHARGE) OPTIME
TOPICAL | Status: DC | PRN
  Administered 2017-02-09 (×2): 1 via TOPICAL

## 2017-02-09 MED ORDER — SODIUM CHLORIDE 0.9% FLUSH: 3.0000 mL | Freq: Two times a day (BID) | INTRAVENOUS | Status: DC

## 2017-02-09 MED ORDER — METHOCARBAMOL 500 MG PO TABS: 500.0000 mg | ORAL_TABLET | Freq: Three times a day (TID) | ORAL | 0 refills | Status: AC

## 2017-02-09 MED ORDER — DOCUSATE SODIUM 100 MG PO CAPS
100.0000 mg | ORAL_CAPSULE | Freq: Two times a day (BID) | ORAL | Status: DC
  Administered 2017-02-09 – 2017-02-10 (×2): 100 mg via ORAL
  Filled 2017-02-09 (×2): qty 1

## 2017-02-09 MED ORDER — ONDANSETRON HCL 4 MG/2ML IJ SOLN: 4.0000 mg | Freq: Four times a day (QID) | INTRAMUSCULAR | Status: DC | PRN

## 2017-02-09 MED ORDER — PHENYLEPHRINE HCL 10 MG/ML IJ SOLN
INTRAVENOUS | Status: DC | PRN
  Administered 2017-02-09: 20 ug/min via INTRAVENOUS

## 2017-02-09 MED ORDER — CEFAZOLIN SODIUM-DEXTROSE 2-4 GM/100ML-% IV SOLN
2.0000 g | INTRAVENOUS | Status: AC
  Administered 2017-02-09 (×2): 2 g via INTRAVENOUS
  Filled 2017-02-09: qty 100

## 2017-02-09 MED ORDER — 0.9 % SODIUM CHLORIDE (POUR BTL) OPTIME
TOPICAL | Status: DC | PRN
  Administered 2017-02-09 (×3): 1000 mL

## 2017-02-09 MED ORDER — FENTANYL CITRATE (PF) 100 MCG/2ML IJ SOLN
25.0000 ug | INTRAMUSCULAR | Status: DC | PRN
  Administered 2017-02-09: 25 ug via INTRAVENOUS
  Administered 2017-02-09: 50 ug via INTRAVENOUS

## 2017-02-09 MED ORDER — ONDANSETRON HCL 4 MG/2ML IJ SOLN
INTRAMUSCULAR | Status: DC | PRN
  Administered 2017-02-09: 4 mg via INTRAVENOUS

## 2017-02-09 MED ORDER — CEFAZOLIN SODIUM-DEXTROSE 2-4 GM/100ML-% IV SOLN
2.0000 g | Freq: Three times a day (TID) | INTRAVENOUS | Status: AC
  Administered 2017-02-09 – 2017-02-10 (×2): 2 g via INTRAVENOUS
  Filled 2017-02-09 (×2): qty 100

## 2017-02-09 MED ORDER — THROMBIN 20000 UNITS EX SOLR: CUTANEOUS | Status: DC | PRN

## 2017-02-09 MED ORDER — EPHEDRINE 5 MG/ML INJ
INTRAVENOUS | Status: AC
  Filled 2017-02-09: qty 10

## 2017-02-09 MED ORDER — MIDAZOLAM HCL 5 MG/5ML IJ SOLN
INTRAMUSCULAR | Status: DC | PRN
  Administered 2017-02-09: 2 mg via INTRAVENOUS

## 2017-02-09 MED ORDER — FENTANYL CITRATE (PF) 100 MCG/2ML IJ SOLN
INTRAMUSCULAR | Status: AC
  Administered 2017-02-09: 50 ug via INTRAVENOUS
  Filled 2017-02-09: qty 2

## 2017-02-09 MED ORDER — POLYETHYLENE GLYCOL 3350 17 G PO PACK: 17.0000 g | PACK | Freq: Every day | ORAL | Status: DC | PRN

## 2017-02-09 MED ORDER — METHOCARBAMOL 1000 MG/10ML IJ SOLN: 500.0000 mg | Freq: Four times a day (QID) | INTRAVENOUS | Status: DC | PRN

## 2017-02-09 MED ORDER — SUGAMMADEX SODIUM 200 MG/2ML IV SOLN
INTRAVENOUS | Status: AC
  Filled 2017-02-09: qty 2

## 2017-02-09 MED ORDER — EPHEDRINE SULFATE-NACL 50-0.9 MG/10ML-% IV SOSY
PREFILLED_SYRINGE | INTRAVENOUS | Status: DC | PRN
  Administered 2017-02-09 (×5): 10 mg via INTRAVENOUS

## 2017-02-09 MED ORDER — PROPOFOL 10 MG/ML IV BOLUS
INTRAVENOUS | Status: DC | PRN
  Administered 2017-02-09: 200 mg via INTRAVENOUS
  Administered 2017-02-09: 40 mg via INTRAVENOUS

## 2017-02-09 MED ORDER — LACTATED RINGERS IV SOLN: INTRAVENOUS | Status: DC

## 2017-02-09 SURGICAL SUPPLY — 74 items
BUR EGG ELITE 4.0 (BURR) IMPLANT
BUR EGG ELITE 4.0MM (BURR)
BUR MATCHSTICK NEURO 3.0 LAGG (BURR) ×3 IMPLANT
CANISTER SUCT 3000ML PPV (MISCELLANEOUS) ×3 IMPLANT
CLOSURE STERI-STRIP 1/2X4 (GAUZE/BANDAGES/DRESSINGS) ×1
CLOSURE WOUND 1/2 X4 (GAUZE/BANDAGES/DRESSINGS) ×1
CLSR STERI-STRIP ANTIMIC 1/2X4 (GAUZE/BANDAGES/DRESSINGS) ×2 IMPLANT
CORDS BIPOLAR (ELECTRODE) ×3 IMPLANT
COVER SURGICAL LIGHT HANDLE (MISCELLANEOUS) ×3 IMPLANT
DERMABOND ADVANCED (GAUZE/BANDAGES/DRESSINGS) ×2
DERMABOND ADVANCED .7 DNX12 (GAUZE/BANDAGES/DRESSINGS) ×1 IMPLANT
DRAIN CHANNEL 15F RND FF W/TCR (WOUND CARE) ×3 IMPLANT
DRAPE POUCH INSTRU U-SHP 10X18 (DRAPES) ×3 IMPLANT
DRAPE SURG 17X23 STRL (DRAPES) ×3 IMPLANT
DRAPE U-SHAPE 47X51 STRL (DRAPES) ×3 IMPLANT
DRSG AQUACEL AG ADV 3.5X10 (GAUZE/BANDAGES/DRESSINGS) IMPLANT
DRSG OPSITE POSTOP 4X10 (GAUZE/BANDAGES/DRESSINGS) ×3 IMPLANT
DRSG OPSITE POSTOP 4X8 (GAUZE/BANDAGES/DRESSINGS) IMPLANT
DURAPREP 26ML APPLICATOR (WOUND CARE) ×3 IMPLANT
ELECT BLADE 4.0 EZ CLEAN MEGAD (MISCELLANEOUS)
ELECT CAUTERY BLADE 6.4 (BLADE) ×3 IMPLANT
ELECT PENCIL ROCKER SW 15FT (MISCELLANEOUS) ×3 IMPLANT
ELECT REM PT RETURN 9FT ADLT (ELECTROSURGICAL) ×3
ELECTRODE BLDE 4.0 EZ CLN MEGD (MISCELLANEOUS) IMPLANT
ELECTRODE REM PT RTRN 9FT ADLT (ELECTROSURGICAL) ×1 IMPLANT
EVACUATOR 1/8 PVC DRAIN (DRAIN) IMPLANT
EVACUATOR SILICONE 100CC (DRAIN) ×3 IMPLANT
GAUZE SPONGE 4X4 12PLY STRL (GAUZE/BANDAGES/DRESSINGS) ×3 IMPLANT
GLOVE BIO SURGEON STRL SZ 6.5 (GLOVE) ×4 IMPLANT
GLOVE BIO SURGEONS STRL SZ 6.5 (GLOVE) ×2
GLOVE BIOGEL PI IND STRL 6.5 (GLOVE) ×2 IMPLANT
GLOVE BIOGEL PI IND STRL 7.0 (GLOVE) ×1 IMPLANT
GLOVE BIOGEL PI IND STRL 8.5 (GLOVE) ×1 IMPLANT
GLOVE BIOGEL PI INDICATOR 6.5 (GLOVE) ×4
GLOVE BIOGEL PI INDICATOR 7.0 (GLOVE) ×2
GLOVE BIOGEL PI INDICATOR 8.5 (GLOVE) ×2
GLOVE SS BIOGEL STRL SZ 8.5 (GLOVE) ×1 IMPLANT
GLOVE SUPERSENSE BIOGEL SZ 8.5 (GLOVE) ×2
GLOVE SURG SS PI 6.5 STRL IVOR (GLOVE) ×6 IMPLANT
GOWN STRL REUS W/ TWL LRG LVL3 (GOWN DISPOSABLE) ×4 IMPLANT
GOWN STRL REUS W/TWL 2XL LVL3 (GOWN DISPOSABLE) ×3 IMPLANT
GOWN STRL REUS W/TWL LRG LVL3 (GOWN DISPOSABLE) ×8
KIT BASIN OR (CUSTOM PROCEDURE TRAY) ×3 IMPLANT
KIT ROOM TURNOVER OR (KITS) ×3 IMPLANT
NEEDLE 22X1 1/2 (OR ONLY) (NEEDLE) ×3 IMPLANT
NEEDLE SPNL 18GX3.5 QUINCKE PK (NEEDLE) ×6 IMPLANT
NS IRRIG 1000ML POUR BTL (IV SOLUTION) ×6 IMPLANT
PACK LAMINECTOMY ORTHO (CUSTOM PROCEDURE TRAY) ×3 IMPLANT
PACK UNIVERSAL I (CUSTOM PROCEDURE TRAY) ×3 IMPLANT
PAD ARMBOARD 7.5X6 YLW CONV (MISCELLANEOUS) ×6 IMPLANT
PATTIES SURGICAL .5 X.5 (GAUZE/BANDAGES/DRESSINGS) IMPLANT
PATTIES SURGICAL .5 X1 (DISPOSABLE) ×9 IMPLANT
SPONGE LAP 18X18 X RAY DECT (DISPOSABLE) ×3 IMPLANT
SPONGE LAP 4X18 X RAY DECT (DISPOSABLE) ×9 IMPLANT
SPONGE SURGIFOAM ABS GEL 100 (HEMOSTASIS) IMPLANT
STRIP CLOSURE SKIN 1/2X4 (GAUZE/BANDAGES/DRESSINGS) ×2 IMPLANT
SURGIFLO W/THROMBIN 8M KIT (HEMOSTASIS) ×3 IMPLANT
SUT BONE WAX W31G (SUTURE) ×3 IMPLANT
SUT MON AB 3-0 SH 27 (SUTURE) ×2
SUT MON AB 3-0 SH27 (SUTURE) ×1 IMPLANT
SUT SILK 2 0 SH (SUTURE) ×3 IMPLANT
SUT VIC AB 0 CT1 27 (SUTURE) ×2
SUT VIC AB 0 CT1 27XBRD ANBCTR (SUTURE) ×1 IMPLANT
SUT VIC AB 1 CT1 18XCR BRD 8 (SUTURE) ×2 IMPLANT
SUT VIC AB 1 CT1 8-18 (SUTURE) ×4
SUT VIC AB 1 CTX 36 (SUTURE) ×4
SUT VIC AB 1 CTX36XBRD ANBCTR (SUTURE) ×2 IMPLANT
SUT VIC AB 2-0 CT1 18 (SUTURE) ×3 IMPLANT
SYR BULB IRRIGATION 50ML (SYRINGE) ×3 IMPLANT
SYR CONTROL 10ML LL (SYRINGE) ×3 IMPLANT
TOWEL OR 17X24 6PK STRL BLUE (TOWEL DISPOSABLE) IMPLANT
TOWEL OR 17X26 10 PK STRL BLUE (TOWEL DISPOSABLE) IMPLANT
WATER STERILE IRR 1000ML POUR (IV SOLUTION) IMPLANT
YANKAUER SUCT BULB TIP NO VENT (SUCTIONS) ×3 IMPLANT

## 2017-02-09 NOTE — Op Note (Signed)
Operative note.  Preoperative diagnosis. Lumbar spinal stenosis. Lumbar disc herniation.  Postoperative diagnosis. Same.  Operative procedure. 1 posterior lumbar decompression L2-S1.    2. L5-S1 discectomy left side.  Complications. None.  First Environmental consultant. [facility].  Indications. 71 year old gentleman with severe back buttock and bilateral leg pain left side worse than the right. Imaging studies demonstrated severe spinal stenosis L 2/3  L3/4 L4/5 moderate spinal stenosis L5/S1 with left-sided disc herniation causing compression of the L5 and S1 nerve root. Due to failed conservative management we elected to proceed with surgery. All appropriate risks benefits and alternatives were discussed with the patient. Consent was obtained.  Operative note. Patient was brought the operating room placed by the operating table. After successful induction of general anesthesia and endotracheal intubation teds SCDs and a Foley were inserted. The patient was turned prone onto the Wilson frame and all bony prominences well-padded. The back was prepped and draped in a standard fashion. Timeout was taken confirming patient procedure and all other pertinent important data.  2 needles were then placed into the back and x-ray was taken for localization of the incision. I marked out the incision site and infiltrated with quarter percent Marcaine. Midline incision was made from the inferior aspect of L1 to the inferior aspect of S1. Sharp dissection was carried out down to the deep fascia. I incised the deep fascia and began stripping the paraspinal muscles using a Cobb and Bovie to expose the posterior lateral aspect posterior aspect of spine from L2-S1. Once I had this achieved I then placed a Penfield 4 underneath the L5 lamina and took a second x-ray. I confirm that I was at the appropriate level.  At this point I began my decompression. Using a double-action Leksell rongeur I removed the spinous processes of  L2-3 for and L5. I then used a Penfield 4 to develop a plane underneath the L5 lamina and performed a central laminotomy of L5. I then dissected through the thickened redundant ligamentum flavum and removed this with the Kerrison punch to expose the thecal sac I continue to walk work from a caudal to cranial fashion. I resected the entire L5 lamina and continued my decompression. There was significant spinal stenosis centrally at the 3/4 and 2/ 3 levels. Great care was taken using neuro curettes to develop a plane underneath the significant central bone spurs. Using 2 mm Kerrison punches Penfield 4 for dissection and Kerrison rongeurs for dissection elderly was able to resect the central lamina at L2 and L3 and L4. At this point I now had excellent visualization of the thecal sac. I then began working my way into the lateral recess. Beginning on the right hand side I used my 2 and 3 mm Kerrison punch to resect the medial aspect of the facet complex and sees osteophytes) until I could palpate and visualize the medial aspect of the pedicle. I continued my lateral recess and foraminal decompression using the Kerrison rongeurs starting at L2 and proceeding down to S1. Once I completed this on the right side I was able to easily pass my [facility] along the lateral recess and out each respective foramen. There was no significant neural compression at this point. I then went to the contralateral side and using my Kerrison rongeurs and gentle technique I was able to perform a lateral recess decompression similar to her performed on the contralateral side. Again I also performed foraminotomies using Kerrison punch to undercut this and adequately decompress the foramen. Again I  used my Surveyor, quantity to confirm an adequate posterior lateral decompression. The lateral recess was completely frayed and the nerve roots were easily palpable out into the foramen at each level. I then turned my attention to the L5-S1  left-sided disc. I gently dissected the S1 nerve root medially and identified the L5-S1 disc space were small fragments of disc material still remaining which I removed using a micropituitary rongeur. At this point the exiting L5 and traversing S1 nerve root were completely decompressed and no longer under neural tension. I took another sweep with my [facility] confirming I could visualize all the pedicles and I had a pedicle to pedicle decompression in the lateral recess and foramen were all decompressed. At this point using bipolar cautery I obtained hemostasis maintained it with FloSeal.  The wound was copiously irrigated with normal saline and the remaining FloSeal was removed. I did place a drain through a separate stab incision. At this point inspection of the thecal sac demonstrated no evidence of CSF leak and the compression was completely resolved. I then closed the deep fascia with interrupted #1 Vicryl sutures, superficial 2-0 Vicryl sutures, and a 3-0 Monocryl for the skin. The drain was sutured with a 2-0 silk. Dry dressing was applied and the patient was ultimately ultimately extubated and transferred to the PACU that incident. The end of the case all needle sponge counts were correct.

## 2017-02-09 NOTE — Brief Op Note (Signed)
02/09/2017  1:27 PM  PATIENT:  Elvera Lennox  71 y.o. male  PRE-OPERATIVE DIAGNOSIS:  Spinal stenosis and L5-S1 HNP  POST-OPERATIVE DIAGNOSIS:  Spinal stenosis and L5-S1 HNP  PROCEDURE:  Procedure(s) with comments: Lumbar decompression L2-S1 with L5-S1 disectomy  (N/A) - 3 h rs  SURGEON:  Surgeon(s) and Role:    [name], MD - Primary  PHYSICIAN ASSISTANT: Carman Mayo  ANESTHESIA:   general  EBL:  Total I/O In: 2250 [I.V.:2000; IV Piggyback:250] Out: 1110 [Urine:410; Blood:700]  BLOOD ADMINISTERED:none  DRAINS: 1 the back   LOCAL MEDICATIONS USED:  MARCAINE     SPECIMEN:  No Specimen  DISPOSITION OF SPECIMEN:  N/A  COUNTS:  YES  TOURNIQUET:  * No tourniquets in log *  DICTATION: .Dragon Dictation  PLAN OF CARE: Admit to inpatient   PATIENT DISPOSITION:  PACU - hemodynamically stable.

## 2017-02-09 NOTE — Transfer of Care (Signed)
Immediate Anesthesia Transfer of Care Note  Patient: Larry Kennedy  Procedure(s) Performed: Procedure(s) with comments: Lumbar decompression L2-S1 with L5-S1 disectomy  (N/A) - 3 h rs  Patient Location: PACU  Anesthesia Type:General  Level of Consciousness: drowsy and patient cooperative  Airway & Oxygen Therapy: Patient Spontanous Breathing and Patient connected to face mask oxygen  Post-op Assessment: Report given to RN and Post -op Vital signs reviewed and stable  Post vital signs: Reviewed and stable  Last Vitals:  Vitals:   02/09/17 0707 02/09/17 1352  BP: 128/73   Pulse: 66   Resp: 20   Temp: 37.7 C 36.8 C    Last Pain:  Vitals:   02/09/17 1352  TempSrc: Oral  PainSc:          Complications: No apparent anesthesia complications

## 2017-02-09 NOTE — Anesthesia Procedure Notes (Signed)
Procedure Name: Intubation Date/Time: 02/09/2017 8:43 AM Performed by: Freddie Breech Pre-anesthesia Checklist: Patient identified, Emergency Drugs available, Suction available and Patient being monitored Patient Re-evaluated:Patient Re-evaluated prior to induction Oxygen Delivery Method: Circle System Utilized Preoxygenation: Pre-oxygenation with 100% oxygen Induction Type: IV induction Ventilation: Mask ventilation without difficulty and Oral airway inserted - appropriate to patient size Laryngoscope Size: Mac and 4 Grade View: Grade II Tube type: Oral Tube size: 7.5 mm Number of attempts: 1 Airway Equipment and Method: Stylet and Oral airway Placement Confirmation: ETT inserted through vocal cords under direct vision,  positive ETCO2 and breath sounds checked- equal and bilateral Tube secured with: Tape Dental Injury: Teeth and Oropharynx as per pre-operative assessment

## 2017-02-09 NOTE — H&P (Signed)
History of Present Illness  The patient is a 71 year old male who presents for a follow-up for H & P. The patient is scheduled for a lumbar decompression L2-S1, with L5-S1 discectomy to be performed by Dr. Duane Lope D. Rolena Infante, MD at [facility] on 02-09-17 . Please see the hospital record for complete dictated history and physical. the pt reports hypertension.   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  Social History  Children  1 Current work status  retired Furniture conservator/restorer daily; does other Former drinker  10/21/2016: In the past drank beer Living situation  live with spouse Marital status  married No history of drug/alcohol rehab  Not under pain contract  Number of flights of stairs before winded  2-3 Tobacco / smoke exposure  10/21/2016: no Tobacco use  Former smoker. 10/21/2016: smoke(d) 3/4 pack(s) per day  Medication History  Carvedilol (12.5MG  Tablet, Oral) Active. (1/2 tab bid) AmLODIPine Besylate (5MG  Tablet, Oral) Active. (qd) Atorvastatin Calcium (40MG  Tablet, Oral) Active. (qd) Tamsulosin HCl (0.4MG  Capsule, Oral) Active. (qd) Restasis (0.05% Emulsion, Ophthalmic) Active. (bid) Medications Reconciled  Other Problems  High blood pressure   Vitals 02/01/2017 8:06 AM Weight: 238 lb Height: 68.5in Body Surface Area: 2.21 m Body Mass Index: 35.66 kg/m  Temp.: 98.76F(Oral)  Pulse: 75 (Regular)  BP: 136/77 (Sitting, Left Arm, Standard)  General General Appearance-Not in acute distress. Orientation-Oriented X3. Build & Nutrition-Well nourished and Well developed.  Integumentary General  Characteristics Surgical Scars - no surgical scar evidence of previous lumbar surgery. Lumbar Spine-Skin examination of the lumbar spine is without deformity, skin lesions, lacerations or abrasions.  Chest and Lung Exam Auscultation Breath sounds - Normal and Clear.  Cardiovascular Auscultation Rhythm - Regular rate and rhythm.  Abdomen Palpation/Percussion Palpation and Percussion of the abdomen reveal - Soft, Non Tender and No Rebound tenderness.  Peripheral Vascular Lower Extremity Palpation - Posterior tibial pulse - Bilateral - 2+. Dorsalis pedis pulse - Bilateral - 2+.  Neurologic Sensation Lower Extremity - Left - sensation is diminished in the lower extremity. Right - sensation is intact in the lower extremity. Reflexes Patellar Reflex - Bilateral - 2+. Achilles Reflex - Bilateral - 2+. Testing Seated Straight Leg Raise - Bilateral - Seated straight leg raise negative.  Musculoskeletal Spine/Ribs/Pelvis  Lumbosacral Spine: Inspection and Palpation - Tenderness - left lumbar paraspinals tender to palpation, right lumbar paraspinals tender to palpation and left buttock is tender to palpation. Strength and Tone: Strength - Hip Flexion - Left - 4-/5. Right - 5/5. Knee Extension - Bilateral - 5/5. Knee Flexion - Bilateral - 5/5. Ankle Dorsiflexion - Bilateral - 5/5. Ankle Plantarflexion - Bilateral - 5/5. Heel walk - Bilateral - unable to heel walk. Toe Walk - Bilateral - unable to walk on toes. ROM - Flexion - moderately decreased range of motion and painful. Extension - moderately decreased range of motion and painful. Left Lateral Bending - moderately decreased range of motion and painful. Right Lateral Bending - moderately decreased range of motion and painful. Right Rotation - moderately decreased range of motion and painful. Left Rotation - moderately decreased range of motion and painful. Pain - neither flexion or extension is more painful than the other. Lumbosacral Spine  - Annamarie Major  Signs - no Waddell's signs present. Lower Extremity Range of Motion - No true hip, knee or ankle pain with range of motion. Gait and Station - [employer] - cane.  IMAGES His MRI from 12/01/2016, shows severe spinal stenosis at L2-3, L3-4, L4-5, moderate to large left disk herniation at L5-S1 with moderate stenosis, moderate stenosis at L1-L2.   Assessment & Plan  Goal Of Surgery: Discussed that goal of surgery is to reduce pain and improve function and quality of life. Patient is aware that despite all appropriate treatment that there pain and function could be the same, worse, or different.  Posterior Lumbar Decompression/disectomy: Risks of surgery include infection, bleeding, nerve damage, death, stroke, paralysis, failure to heal, need for further surgery, ongoing or worse pain, need for further surgery, CSF leak, loss of bowel or bladder, and recurrent disc herniation or Stenosis which would necessitate need for further surgery.  At this point in time, the patient does have severe spinal stenosis at L2-3, L3-4, L4-5, L5-S1 along with a large L5-S1 left-sided disc herniation. We have talked about repeat injections, which I think would be reasonable, but at this point his quality of life is poor and he really would like something done to address the spinal stenosis. At this point, I think the best option is a multilevel lumbar decompression. This would be an L2 to S1 decompression with discectomy at L5-S1. We have reviewed the procedure. All of his and his wife's questions were addressed. We have gone over the risks and benefits.

## 2017-02-09 NOTE — Discharge Instructions (Signed)
Laminectomy, Care After °This sheet gives you information about how to care for yourself after your procedure. Your health care provider may also give you more specific instructions. If you have problems or questions, contact your health care provider. °What can I expect after the procedure? °After the procedure, it is common to have: °· Some pain around your incision area. °· Muscle tightening (spasms) across the back. ° °Follow these instructions at home: °Incision care °· Follow instructions from your health care provider about how to take care of your incision area. Make sure you: °? Wash your hands with soap and water before and after you apply medicine to the area or change your bandage (dressing). If soap and water are not available, use hand sanitizer. °? Change your dressing as told by your health care provider. °? Leave stitches (sutures), skin glue, or adhesive strips in place. These skin closures may need to stay in place for 2 weeks or longer. If adhesive strip edges start to loosen and curl up, you may trim the loose edges. Do not remove adhesive strips completely unless your health care provider tells you to do that. °· Check your incision area every day for signs of infection. Check for: °? More redness, swelling, or pain. °? More fluid or blood. °? Warmth. °? Pus or a bad smell. °Medicines °· Take over-the-counter and prescription medicines only as told by your health care provider. °· If you were prescribed an antibiotic medicine, use it as told by your health care provider. Do not stop using the antibiotic even if you start to feel better. °Bathing °· Do not take baths, swim, or use a hot tub for 2 weeks, or until your incision has healed completely. °· If your health care provider approves, you may take showers after your dressing has been removed. °Activity °· Return to your normal activities as told by your health care provider. Ask your health care provider what activities are safe for  you. °· Avoid bending or twisting at your waist. Always bend at your knees. °· Do not sit for more than 20-30 minutes at a time. Lie down or walk between periods of sitting. °· Do not lift anything that is heavier than 10 lb (4.5 kg) or the limit that your health care provider tells you, until he or she says that it is safe. °· Do not drive for 2 weeks after your procedure or for as long as your health care provider tells you. °· Do not drive or use heavy machinery while taking prescription pain medicine. °General instructions °· To prevent or treat constipation while you are taking prescription pain medicine, your health care provider may recommend that you: °? Drink enough fluid to keep your urine clear or pale yellow. °? Take over-the-counter or prescription medicines. °? Eat foods that are high in fiber, such as fresh fruits and vegetables, whole grains, and beans. °? Limit foods that are high in fat and processed sugars, such as fried and sweet foods. °· Do breathing exercises as told. °· Keep all follow-up visits as told by your health care provider. This is important. °Contact a health care provider if: °· You have more redness, swelling, or pain around your incision area. °· Your incision feels warm to the touch. °· You are not able to return to activities or do exercises as told by your health care provider. °Get help right away if: °· You have: °? More fluid or blood coming from your incision area. °? Pus or   a bad smell coming from your incision area. °? Chills or a fever. °? Episodes of dizziness or fainting while standing. °· You develop a rash. °· You develop shortness of breath or you have difficulty breathing. °· You cannot control when you urinate or have a bowel movement. °· You become weak. °· You are not able to use your legs. °Summary °· After the procedure, it is common to have some pain around your incision area. You may also have muscle tightening (spasms) across the back. °· Follow  instructions from your health care provider about how to care for your incision. °· Do not lift anything that is heavier than 10 lb (4.5 kg) or the limit that your health care provider tells you, until he or she says that it is safe. °· Contact your health care provider if you have more redness, swelling, or pain around your incision area or if your incision feels warm to the touch. These can be signs of infection. °This information is not intended to replace advice given to you by your health care provider. Make sure you discuss any questions you have with your health care provider. °Document Released: 01/08/2005 Document Revised: 02/05/2016 Document Reviewed: 12/07/2015 °Elsevier Interactive Patient Education © 2018 [employer] ° °

## 2017-02-09 NOTE — Anesthesia Preprocedure Evaluation (Addendum)
Anesthesia Evaluation  Patient identified by MRN, date of birth, ID band Patient awake    Reviewed: Allergy & Precautions, NPO status , Patient's Chart, lab work & pertinent test results  Airway Mallampati: II  TM Distance: >3 FB Neck ROM: Full    Dental no notable dental hx. (+) Caps   Pulmonary neg pulmonary ROS, former smoker,    Pulmonary exam normal breath sounds clear to auscultation       Cardiovascular hypertension, Pt. on medications Normal cardiovascular exam Rhythm:Regular Rate:Normal     Neuro/Psych negative neurological ROS  negative psych ROS   GI/Hepatic negative GI ROS, Neg liver ROS,   Endo/Other  negative endocrine ROS  Renal/GU negative Renal ROS  negative genitourinary   Musculoskeletal negative musculoskeletal ROS (+)   Abdominal   Peds negative pediatric ROS (+)  Hematology negative hematology ROS (+)   Anesthesia Other Findings   Reproductive/Obstetrics negative OB ROS                            Anesthesia Physical Anesthesia Plan  ASA: II  Anesthesia Plan: General   Post-op Pain Management:    Induction: Intravenous  PONV Risk Score and Plan: 3 and Ondansetron, Dexamethasone and Midazolam  Airway Management Planned: Oral ETT  Additional Equipment:   Intra-op Plan:   Post-operative Plan: Extubation in OR  Informed Consent: I have reviewed the patients History and Physical, chart, labs and discussed the procedure including the risks, benefits and alternatives for the proposed anesthesia with the patient or authorized representative who has indicated his/her understanding and acceptance.   Dental advisory given  Plan Discussed with: CRNA  Anesthesia Plan Comments:         Anesthesia Quick Evaluation

## 2017-02-10 ENCOUNTER — Encounter (HOSPITAL_COMMUNITY): Payer: Self-pay | Admitting: Orthopedic Surgery

## 2017-02-10 DIAGNOSIS — M48062 Spinal stenosis, lumbar region with neurogenic claudication: Secondary | ICD-10-CM | POA: Diagnosis not present

## 2017-02-10 DIAGNOSIS — M5136 Other intervertebral disc degeneration, lumbar region: Secondary | ICD-10-CM | POA: Diagnosis not present

## 2017-02-10 LAB — CBC
HEMATOCRIT: 35.2 % — AB (ref 39.0–52.0)
Hemoglobin: 12 g/dL — ABNORMAL LOW (ref 13.0–17.0)
MCH: 30.2 pg (ref 26.0–34.0)
MCHC: 34.1 g/dL (ref 30.0–36.0)
MCV: 88.4 fL (ref 78.0–100.0)
Platelets: 189 10*3/uL (ref 150–400)
RBC: 3.98 MIL/uL — ABNORMAL LOW (ref 4.22–5.81)
RDW: 13.5 % (ref 11.5–15.5)
WBC: 12.9 10*3/uL — ABNORMAL HIGH (ref 4.0–10.5)

## 2017-02-10 NOTE — Evaluation (Signed)
Physical Therapy Evaluation/Discharge  Patient Details Name: Larry Kennedy MRN: [id number] DOB: 05/08/1946 Today's Date: 02/10/2017   History of Present Illness  Pt is a 71 y.o. s/p L2-S1 lumbar decompression, L5-S1 disectomy. PMH: HBP  Clinical Impression  Upon arrival pt eager to mobilize with PT. Pt able to ambulate in hall and navigate stairs without physical assistance and with safe technique. Pt and wife able to recall back precautions and safe execution of functional mobility to maintain back precautions. Pt at Mod I level for all mobility and PT to sign off. Patient agreeable and feel pt is safe to return home with wife to give assistance with heavy ADLs. If needs change, please reconsult.    Follow Up Recommendations No PT follow up    Equipment Recommendations  None recommended by PT    Recommendations for Other Services       Precautions / Restrictions Precautions Precautions: Back Precaution Booklet Issued: Yes (comment) Precaution Comments: Reviewed back precautions with patient  Required Braces or Orthoses: Spinal Brace Spinal Brace: Lumbar corset Restrictions Weight Bearing Restrictions: No      Mobility  Bed Mobility               General bed mobility comments: Pt in chair upon arrival. Pt states he practiced bed mobility with OT earlier. Pt was able to accurately describe proper technique for log rolling.   Transfers Overall transfer level: Needs assistance   Transfers: Sit to/from Stand Sit to Stand: Modified independent (Device/Increase time)         General transfer comment: Pt with use of arm rests and increased time   Ambulation/Gait Ambulation/Gait assistance: Modified independent (Device/Increase time) Ambulation Distance (Feet): 400 Feet Assistive device: Straight cane Gait Pattern/deviations: Step-through pattern;Trunk flexed     General Gait Details: Pt with good step through pattern and no apparent deviations. Mod I with use of  cane.  Stairs Stairs: Yes Stairs assistance: Modified independent (Device/Increase time) Stair Management: One rail Left;Step to pattern;Forwards Number of Stairs: 10 General stair comments: Mod I with use of Left rail and VCs for sequencing   Wheelchair Mobility    Modified Rankin (Stroke Patients Only)       Balance Overall balance assessment: Needs assistance Sitting-balance support: Feet supported;No upper extremity supported Sitting balance-Leahy Scale: Good     Standing balance support: Single extremity supported;During functional activity Standing balance-Leahy Scale: Fair Standing balance comment: Pt uses straight cane with ambulation and states it is just as a "safety blanket." Pt does not require use of cane to maintain balance.                              Pertinent Vitals/Pain Pain Assessment: No/denies pain    Home Living Family/patient expects to be discharged to:: Private residence Living Arrangements: Spouse/significant other Available Help at Discharge: Family Type of Home: House Home Access: Stairs to enter Entrance Stairs-Rails: Left Entrance Stairs-Number of Steps: 8 Home Layout: One level Home Equipment: Cane - single point;Grab bars - tub/shower;Shower seat      Prior Function Level of Independence: Independent with assistive device(s)         Comments: Cane for mobility     Hand Dominance        Extremity/Trunk Assessment   Upper Extremity Assessment Upper Extremity Assessment: Overall WFL for tasks assessed    Lower Extremity Assessment Lower Extremity Assessment: Overall WFL for tasks assessed    Cervical /  Trunk Assessment Cervical / Trunk Assessment: Other exceptions Cervical / Trunk Exceptions: s/p spinal sx  Communication   Communication: No difficulties  Cognition Arousal/Alertness: Awake/alert Behavior During Therapy: WFL for tasks assessed/performed Overall Cognitive Status: Within Functional Limits  for tasks assessed                                        General Comments      Exercises     Assessment/Plan    PT Assessment Patent does not need any further PT services  PT Problem List         PT Treatment Interventions      PT Goals (Current goals can be found in the Care Plan section)  Acute Rehab PT Goals Patient Stated Goal: go home PT Goal Formulation: With patient/family Time For Goal Achievement: 02/24/17 Potential to Achieve Goals: Good    Frequency     Barriers to discharge        Co-evaluation               AM-PAC PT "6 Clicks" Daily Activity  Outcome Measure Difficulty turning over in bed (including adjusting bedclothes, sheets and blankets)?: None Difficulty moving from lying on back to sitting on the side of the bed? : A Little Difficulty sitting down on and standing up from a chair with arms (e.g., wheelchair, bedside commode, etc,.)?: None Help needed moving to and from a bed to chair (including a wheelchair)?: None Help needed walking in hospital room?: None Help needed climbing 3-5 steps with a railing? : A Little 6 Click Score: 22    End of Session Equipment Utilized During Treatment: Back brace Activity Tolerance: Patient tolerated treatment well Patient left: in chair;with call bell/phone within reach;with family/visitor present;with nursing/sitter in room Nurse Communication: Mobility status PT Visit Diagnosis: Other symptoms and signs involving the nervous system (R29.898);Pain Pain - part of body:  (back)    Time: 1350-1400 PT Time Calculation (min) (ACUTE ONLY): 10 min   Charges:   PT Evaluation $PT Eval Low Complexity: 1 Low     PT G CodesElberta Leatherwood, SPT Acute Rehab [place] 02/10/2017, 2:32 PM

## 2017-02-10 NOTE — Anesthesia Postprocedure Evaluation (Signed)
Anesthesia Post Note  Patient: Larry Kennedy  Procedure(s) Performed: Procedure(s) (LRB): Lumbar decompression L2-S1 with L5-S1 disectomy  (N/A)     Patient location during evaluation: PACU Anesthesia Type: General Level of consciousness: awake and alert Pain management: pain level controlled Vital Signs Assessment: post-procedure vital signs reviewed and stable Respiratory status: spontaneous breathing, nonlabored ventilation, respiratory function stable and patient connected to nasal cannula oxygen Cardiovascular status: blood pressure returned to baseline and stable Postop Assessment: no signs of nausea or vomiting Anesthetic complications: no    Last Vitals:  Vitals:   02/09/17 2354 02/10/17 0400  BP: 108/68 107/64  Pulse: 81 70  Resp: 20 18  Temp: 37 C 36.7 C    Last Pain:  Vitals:   02/10/17 0537  TempSrc:   PainSc: 3                  Montez Hageman

## 2017-02-10 NOTE — Progress Notes (Signed)
    Subjective: Procedure(s) (LRB): Lumbar decompression L2-S1 with L5-S1 disectomy  (N/A) 1 Day Post-Op  Patient reports pain as 2 on 0-10 scale.  Reports decreased leg pain reports incisional back pain   Positive void Negative bowel movement Negative flatus Negative chest pain or shortness of breath  Objective: Vital signs in last 24 hours: Temp:  [97.8 F (36.6 C)-98.7 F (37.1 C)] 98.1 F (36.7 C) (08/09 0400) Pulse Rate:  [56-89] 70 (08/09 0400) Resp:  [14-21] 18 (08/09 0400) BP: (89-113)/(58-72) 107/64 (08/09 0400) SpO2:  [95 %-99 %] 97 % (08/09 0400)  Intake/Output from previous day: 08/08 0701 - 08/09 0700 In: 3040 [P.O.:240; I.V.:2500; IV Piggyback:300] Out: 3065 [Urine:2110; Drains:255; Blood:700]  Labs:  Recent Labs  02/10/17 0550  WBC 12.9*  RBC 3.98*  HCT 35.2*  PLT 189   No results for input(s): NA, K, CL, CO2, BUN, CREATININE, GLUCOSE, CALCIUM in the last 72 hours. No results for input(s): LABPT, INR in the last 72 hours.  Physical Exam: Neurologically intact ABD soft Sensation intact distally Dorsiflexion/Plantar flexion intact Incision: dressing C/D/I Compartment soft  Assessment/Plan: Patient stable  xrays n/a Continue mobilization with physical therapy Continue care  Advance diet Up with therapy  Drain: 255 total since surgery.  60 cc overnight/10 this AM. Will remove drain around 11 am if output remains low. Plan on d/c to home this afternoon  [name], MD [place] [phone]

## 2017-02-10 NOTE — Evaluation (Signed)
Occupational Therapy Evaluation and Discharge Patient Details Name: Larry Kennedy MRN: [id number] DOB: April 28, 1946 Today's Date: 02/10/2017    History of Present Illness Pt is a 71 y.o. s/p L2-S1 lumbar decompression, L5-S1 disectomy.    Clinical Impression   Pt reports he was independent with ADL PTA. Currently pt requires supervision with ADL and functional mobility with the exception of min assist for LB dressing. All back, safety, and ADL education completed with pt. Pt planning to d/c home with 24/7 supervision from family. No further acute OT needs identified; signing off at this time. Please re-consult if needs change. Thank you for this referral.    Follow Up Recommendations  No OT follow up;Supervision/Assistance - 24 hour (initially)    Equipment Recommendations  None recommended by OT    Recommendations for Other Services       Precautions / Restrictions Precautions Precautions: Back Precaution Booklet Issued: Yes (comment) Precaution Comments: Educated pt on back precautions Required Braces or Orthoses: Spinal Brace Spinal Brace: Lumbar corset Restrictions Weight Bearing Restrictions: No      Mobility Bed Mobility Overal bed mobility: Needs Assistance Bed Mobility: Rolling;Sidelying to Sit Rolling: Supervision Sidelying to sit: Supervision       General bed mobility comments: Supervision for safety. Verbal cues for log roll technique. HOB flat with use of bed rail  Transfers Overall transfer level: Needs assistance Equipment used: None Transfers: Sit to/from Stand Sit to Stand: Supervision         General transfer comment: Supervision for safety.    Balance Overall balance assessment: Needs assistance Sitting-balance support: Feet supported;No upper extremity supported Sitting balance-Leahy Scale: Good     Standing balance support: No upper extremity supported;During functional activity Standing balance-Leahy Scale: Poor                              ADL either performed or assessed with clinical judgement   ADL Overall ADL's : Needs assistance/impaired Eating/Feeding: Set up;Sitting   Grooming: Supervision/safety;Standing Grooming Details (indicate cue type and reason): Educated on use of 2 cups for oral care Upper Body Bathing: Set up;Sitting   Lower Body Bathing: Supervison/ safety;Sit to/from stand   Upper Body Dressing : Set up;Sitting Upper Body Dressing Details (indicate cue type and reason): to don brace Lower Body Dressing: Minimal assistance;Sit to/from stand Lower Body Dressing Details (indicate cue type and reason): wife to assist as needed Toilet Transfer: Supervision/safety;Ambulation Toilet Transfer Details (indicate cue type and reason): simulated by sit to stand from EOB with functional mobility   Toileting - Clothing Manipulation Details (indicate cue type and reason): educated on proper technique for peri care without twisitng and use of wet wipes Tub/ Shower Transfer: Supervision/safety;Tub transfer;Ambulation Tub/Shower Transfer Details (indicate cue type and reason): simulated in room with supervision. discussed use of shower seat for safety and superivison initially Functional mobility during ADLs: Supervision/safety General ADL Comments: Educated pt on maintaining back precautions during functional activities, keeping frequently used items at counter top height, log roll for bed mobility, and frequent mobility throughout the day upon return home.     Vision         Perception     Praxis      Pertinent Vitals/Pain Pain Assessment: No/denies pain     Hand Dominance     Extremity/Trunk Assessment Upper Extremity Assessment Upper Extremity Assessment: Overall WFL for tasks assessed   Lower Extremity Assessment Lower Extremity Assessment: Defer to PT evaluation  Cervical / Trunk Assessment Cervical / Trunk Assessment: Other exceptions Cervical / Trunk Exceptions: s/p spinal sx    Communication Communication Communication: No difficulties   Cognition Arousal/Alertness: Awake/alert Behavior During Therapy: WFL for tasks assessed/performed Overall Cognitive Status: Within Functional Limits for tasks assessed                                     General Comments       Exercises     Shoulder Instructions      Home Living Family/patient expects to be discharged to:: Private residence Living Arrangements: Spouse/significant other Available Help at Discharge: Family Type of Home: House Home Access: Stairs to enter Technical brewer of Steps: [place]: One level     Bathroom Shower/Tub: Tub/shower unit;Door   [employer] Toilet: Standard     Home Equipment: Cane - single point;Grab bars - tub/shower;Shower seat          Prior Functioning/Environment Level of Independence: Independent with assistive device(s)        Comments: Cane for mobility        OT Problem List:        OT Treatment/Interventions:      OT Goals(Current goals can be found in the care plan section) Acute Rehab OT Goals Patient Stated Goal: go home OT Goal Formulation: All assessment and education complete, DC therapy  OT Frequency:     Barriers to D/C:            Co-evaluation              AM-PAC PT "6 Clicks" Daily Activity     Outcome Measure Help from another person eating meals?: None Help from another person taking care of personal grooming?: A Little Help from another person toileting, which includes using toliet, bedpan, or urinal?: A Little Help from another person bathing (including washing, rinsing, drying)?: A Little Help from another person to put on and taking off regular upper body clothing?: A Little Help from another person to put on and taking off regular lower body clothing?: A Little 6 Click Score: 19   End of Session Equipment Utilized During Treatment: Back brace Nurse Communication: Mobility status;Other  (comment) (no equipment or f/u needs)  Activity Tolerance: Patient tolerated treatment well Patient left: in chair;with call bell/phone within reach  OT Visit Diagnosis: Other abnormalities of gait and mobility (R26.89)                Time: 5329-9242 OT Time Calculation (min): 19 min Charges:  OT General Charges $OT Visit: 1 Procedure OT Evaluation $OT Eval Moderate Complexity: 1 Procedure G-Codes:     Charnetta Wulff A. Ulice Brilliant, M.S., OTR/L Pager: [place] 02/10/2017, 9:42 AM

## 2017-02-11 NOTE — Progress Notes (Signed)
Patient is discharged from room 3C10 alert and in stable condition. IV site d/c'd and instructions read to patient and wife with understanding verbalized. Left unit via wheelchair with all belongings at side.

## 2017-02-14 NOTE — Discharge Summary (Signed)
Physician Discharge Summary  Patient ID: Larry Kennedy MRN: [id number] DOB/AGE: [birth year] 71 y.o.  Admit date: 02/09/2017 Discharge date: 02/10/17  Admission Diagnoses:  Lumbar DDD  Discharge Diagnoses:  Active Problems:   Status post lumbar discectomy   Status post lumbar spine operation   Past Medical History:  Diagnosis Date  . Enlarged prostate   . Hypertension     Surgeries: Procedure(s): Lumbar decompression L2-S1 with L5-S1 disectomy  on 02/09/2017   Consultants (if any):   Discharged Condition: Improved  Hospital Course: Riyaan Heroux is an 71 y.o. male who was admitted 02/09/2017 with a diagnosis of Lumbar DDD and went to the operating room on 02/09/2017 and underwent the above named procedures.  Post op day one pt reported mild pain controlle don oral medication.  Pt voiding w/o difficulty.  Pt ambulating in hallway.  Pt cleared by PT for DC.   He was given perioperative antibiotics:  Anti-infectives    Start     Dose/Rate Route Frequency Ordered Stop   02/09/17 2100  ceFAZolin (ANCEF) IVPB 2g/100 mL premix     2 g 200 mL/hr over 30 Minutes Intravenous Every 8 hours 02/09/17 1703 02/10/17 0537   02/09/17 0549  ceFAZolin (ANCEF) IVPB 2g/100 mL premix     2 g 200 mL/hr over 30 Minutes Intravenous 30 min pre-op 02/09/17 0549 02/09/17 1250    .  He was given sequential compression devices, early ambulation, and TED for DVT prophylaxis.  He benefited maximally from the hospital stay and there were no complications.    Recent vital signs:  Vitals:   02/10/17 0400 02/10/17 0800  BP: 107/64 (!) 112/58  Pulse: 70 82  Resp: 18 16  Temp: 98.1 F (36.7 C) 98.3 F (36.8 C)  SpO2: 97%     Recent laboratory studies:  Lab Results  Component Value Date   HGB 12.0 (L) 02/10/2017   HGB 15.2 02/03/2017   HGB 16.0 12/30/2013   Lab Results  Component Value Date   WBC 12.9 (H) 02/10/2017   PLT 189 02/10/2017   Lab Results  Component Value Date   INR 0.98 12/03/2009    Lab Results  Component Value Date   NA 139 02/03/2017   K 3.6 02/03/2017   CL 106 02/03/2017   CO2 25 02/03/2017   BUN 13 02/03/2017   CREATININE 0.97 02/03/2017   GLUCOSE 140 (H) 02/03/2017    Discharge Medications:   Allergies as of 02/11/2017      Reactions   Hydrocodone-acetaminophen Anxiety    panic attack      Medication List    STOP taking these medications   acetaminophen 500 MG tablet Commonly known as:  TYLENOL     TAKE these medications   amLODipine 5 MG tablet Commonly known as:  NORVASC Take 5 mg by mouth daily.   aspirin EC 81 MG tablet Take 81 mg by mouth daily.   atorvastatin 40 MG tablet Commonly known as:  LIPITOR Take 40 mg by mouth every evening.   carvedilol 12.5 MG tablet Commonly known as:  COREG Take 18.75 mg by mouth 2 (two) times daily.   Cod Liver Oil Caps Take 2-3 capsules by mouth 2 (two) times daily.   cycloSPORINE 0.05 % ophthalmic emulsion Commonly known as:  RESTASIS Place 1 drop into both eyes 2 (two) times daily.   FLAX SEED OIL PO Take 1 capsule by mouth 2 (two) times daily.   fluticasone 50 MCG/ACT nasal spray Commonly known as:  FLONASE Place 1 spray into both nostrils daily as needed for allergies.   methocarbamol 500 MG tablet Commonly known as:  ROBAXIN Take 1 tablet (500 mg total) by mouth 3 (three) times daily.   multivitamin with minerals Tabs tablet Take 1 tablet by mouth daily.   ondansetron 4 MG disintegrating tablet Commonly known as:  ZOFRAN ODT Take 1 tablet (4 mg total) by mouth every 8 (eight) hours as needed for nausea or vomiting.   oxyCODONE-acetaminophen 10-325 MG tablet Commonly known as:  PERCOCET Take 1 tablet by mouth every 4 (four) hours as needed for pain.   saw palmetto 160 MG capsule Take 160 mg by mouth 2 (two) times daily.   tamsulosin 0.4 MG Caps capsule Commonly known as:  FLOMAX Take 0.4 mg by mouth every evening.       Diagnostic Studies: Dg Lumbar Spine 2-3  Views  Result Date: 02/09/2017 CLINICAL DATA:  Intraoperative localization for decompression EXAM: LUMBAR SPINE - 2-3 VIEW COMPARISON:  12/30/2013 FINDINGS: The initial lateral image demonstrates needles within the posterior soft tissues at the L2-3 level and Just below the L5-S1 interspace. A subsequent film obtained later in the procedure shows surgical instruments in the spinal canal at the L2-3 and S1 levels IMPRESSION: Intraoperative lumbar localization. Electronically Signed   By: Inez Catalina M.D.   On: 02/09/2017 12:59   Dg Spine Portable 1 View  Result Date: 02/09/2017 CLINICAL DATA:  L2-S1 decompression.  L5-S1 discectomy. EXAM: PORTABLE SPINE - 1 VIEW COMPARISON:  CT abdomen and pelvis 12/30/2013. FINDINGS: A single lateral view of the lumbar spine is submitted. The inferior surgical probe is directed at the L5-S1 level. The superior clamp is at the L3 level. IMPRESSION: 1. Intraoperative localization of the L3 spinous process. 2. Intraoperative localization of the L5-S1 lumbar disc. These results were called by telephone at the time of interpretation on 02/09/2017 at 9:54 am to Dr. Verita Schneiders OR nurse , who verbally acknowledged these results. Electronically Signed   By: San Morelle M.D.   On: 02/09/2017 09:55    Disposition: 01-Home or Self Care Pt will present to clinic in 2 weeks Post op medications provided for the pt  Discharge Instructions    Incentive spirometry RT    Complete by:  As directed          Signed: Valinda Hoar 02/14/2017, 1:18 PM

## 2017-02-16 ENCOUNTER — Encounter (HOSPITAL_COMMUNITY): Payer: Self-pay | Admitting: Emergency Medicine

## 2017-02-16 ENCOUNTER — Ambulatory Visit (HOSPITAL_COMMUNITY)
Admission: EM | Admit: 2017-02-16 | Discharge: 2017-02-17 | Disposition: A | Discharge status: Home / Self Care | Payer: Medicare Other | Attending: Emergency Medicine | Admitting: Emergency Medicine

## 2017-02-16 ENCOUNTER — Other Ambulatory Visit: Payer: Self-pay

## 2017-02-16 ENCOUNTER — Emergency Department (HOSPITAL_COMMUNITY): Payer: Medicare Other

## 2017-02-16 DIAGNOSIS — M545 Low back pain: Secondary | ICD-10-CM | POA: Diagnosis not present

## 2017-02-16 DIAGNOSIS — X58XXXA Exposure to other specified factors, initial encounter: Secondary | ICD-10-CM | POA: Insufficient documentation

## 2017-02-16 DIAGNOSIS — Z9889 Other specified postprocedural states: Secondary | ICD-10-CM | POA: Diagnosis not present

## 2017-02-16 DIAGNOSIS — N400 Enlarged prostate without lower urinary tract symptoms: Secondary | ICD-10-CM | POA: Diagnosis not present

## 2017-02-16 DIAGNOSIS — K449 Diaphragmatic hernia without obstruction or gangrene: Secondary | ICD-10-CM | POA: Insufficient documentation

## 2017-02-16 DIAGNOSIS — Z7982 Long term (current) use of aspirin: Secondary | ICD-10-CM | POA: Diagnosis not present

## 2017-02-16 DIAGNOSIS — Z885 Allergy status to narcotic agent status: Secondary | ICD-10-CM | POA: Diagnosis not present

## 2017-02-16 DIAGNOSIS — K209 Esophagitis, unspecified: Secondary | ICD-10-CM | POA: Diagnosis not present

## 2017-02-16 DIAGNOSIS — I10 Essential (primary) hypertension: Secondary | ICD-10-CM | POA: Insufficient documentation

## 2017-02-16 DIAGNOSIS — K222 Esophageal obstruction: Secondary | ICD-10-CM | POA: Insufficient documentation

## 2017-02-16 DIAGNOSIS — Z87891 Personal history of nicotine dependence: Secondary | ICD-10-CM | POA: Diagnosis not present

## 2017-02-16 DIAGNOSIS — T18128A Food in esophagus causing other injury, initial encounter: Secondary | ICD-10-CM | POA: Diagnosis not present

## 2017-02-16 DIAGNOSIS — Z79899 Other long term (current) drug therapy: Secondary | ICD-10-CM | POA: Insufficient documentation

## 2017-02-16 LAB — I-STAT CHEM 8, ED
BUN: 23 mg/dL — ABNORMAL HIGH (ref 6–20)
CHLORIDE: 101 mmol/L (ref 101–111)
Calcium, Ion: 1.12 mmol/L — ABNORMAL LOW (ref 1.15–1.40)
Creatinine, Ser: 1.1 mg/dL (ref 0.61–1.24)
Glucose, Bld: 123 mg/dL — ABNORMAL HIGH (ref 65–99)
HEMATOCRIT: 34 % — AB (ref 39.0–52.0)
HEMOGLOBIN: 11.6 g/dL — AB (ref 13.0–17.0)
POTASSIUM: 3.9 mmol/L (ref 3.5–5.1)
SODIUM: 137 mmol/L (ref 135–145)
TCO2: 25 mmol/L (ref 0–100)

## 2017-02-16 MED ORDER — GLUCAGON HCL RDNA (DIAGNOSTIC) 1 MG IJ SOLR
1.0000 mg | Freq: Once | INTRAMUSCULAR | Status: DC
  Filled 2017-02-16: qty 1

## 2017-02-16 MED ORDER — ONDANSETRON HCL 4 MG/2ML IJ SOLN
4.0000 mg | Freq: Once | INTRAMUSCULAR | Status: AC
  Administered 2017-02-16: 4 mg via INTRAVENOUS
  Filled 2017-02-16: qty 2

## 2017-02-16 MED ORDER — FENTANYL CITRATE (PF) 100 MCG/2ML IJ SOLN
50.0000 ug | Freq: Once | INTRAMUSCULAR | Status: AC
  Administered 2017-02-16: 50 ug via INTRAVENOUS
  Filled 2017-02-16: qty 2

## 2017-02-16 NOTE — Consult Note (Signed)
Consultation  Referring Provider: Dr. Christy Gentles, [facility] Physician:  [facility]ologist:  unassigned  Reason for Consultation:  Esophageal food impaction  HPI: Larry Kennedy is a 71 y.o. male with PMH of HTN presenting to the ED with esophageal food impaction after eating a hotdog around 3pm today (02/16/17).  He denies history of frequent heartburn. Recalls about 7 years ago having a piece of steak stuck in his esophagus after swallowing that he was able to get this back up without seeking care.  He has been spitting his secretions and unable to swallow liquid since 3:20 PM today. This has been anxiety provoking but he denies chest pain and shortness of breath. He is having lower back pain. He had lumbar disc surgery 7 days ago at Dr. Rolena Infante. He has been taking oxycodone and muscle relaxers since surgery.  He started his baby aspirin therapy back today but no other blood thinners or anticoagulation.   Past Medical History:  Diagnosis Date  . Enlarged prostate   . Hypertension     Past Surgical History:  Procedure Laterality Date  . DG ELBOW RIGHT COMPLETE ([place] HX)     40 yrs ago  . JOINT REPLACEMENT    . KNEE SURGERY    . LUMBAR LAMINECTOMY/DECOMPRESSION MICRODISCECTOMY N/A 02/09/2017   Procedure: Lumbar decompression L2-S1 with L5-S1 disectomy ;  Surgeon: [name], MD;  Location: [place];  Service: Orthopedics;  Laterality: N/A;  3 h rs  . TONSILLECTOMY      Prior to Admission medications   Medication Sig Start Date End Date Taking? Authorizing Provider  amLODipine (NORVASC) 5 MG tablet Take 5 mg by mouth daily. 01/03/17   [provider]  aspirin EC 81 MG tablet Take 81 mg by mouth daily.    [provider]  atorvastatin (LIPITOR) 40 MG tablet Take 40 mg by mouth every evening.  11/15/13   [provider]  carvedilol (COREG) 12.5 MG tablet Take 18.75 mg by mouth 2 (two) times  daily. 01/03/17   [provider]  Cod Liver Oil CAPS Take 2-3 capsules by mouth 2 (two) times daily.     [provider]  cycloSPORINE (RESTASIS) 0.05 % ophthalmic emulsion Place 1 drop into both eyes 2 (two) times daily.    [provider]  Flaxseed, Linseed, (FLAX SEED OIL PO) Take 1 capsule by mouth 2 (two) times daily.     [provider]  fluticasone (FLONASE) 50 MCG/ACT nasal spray Place 1 spray into both nostrils daily as needed for allergies. 11/02/16   [provider]  methocarbamol (ROBAXIN) 500 MG tablet Take 1 tablet (500 mg total) by mouth 3 (three) times daily. 02/09/17   Mayo, [name], PA-C  Multiple Vitamin (MULTIVITAMIN WITH MINERALS) TABS tablet Take 1 tablet by mouth daily.    [provider]  ondansetron (ZOFRAN ODT) 4 MG disintegrating tablet Take 1 tablet (4 mg total) by mouth every 8 (eight) hours as needed for nausea or vomiting. 02/09/17   Mayo, [name], PA-C  oxyCODONE-acetaminophen (PERCOCET) 10-325 MG tablet Take 1 tablet by mouth every 4 (four) hours as needed for pain. 02/10/17 02/17/18  Mayo, [name], PA-C  saw palmetto 160 MG capsule Take 160 mg by mouth 2 (two) times daily.    [provider]  tamsulosin (FLOMAX) 0.4 MG CAPS capsule Take 0.4 mg by mouth every evening. 12/07/16   [provider]    Current Facility-Administered  Medications  Medication Dose Route Frequency Provider Last Rate Last Dose  . fentaNYL (SUBLIMAZE) injection 50 mcg  50 mcg Intravenous Once Ripley Fraise, MD      . glucagon (human recombinant) (GLUCAGEN) injection 1 mg  1 mg Subcutaneous Once Ripley Fraise, MD      . ondansetron [facility]) injection 4 mg  4 mg Intravenous Once Ripley Fraise, MD       Current Outpatient Prescriptions  Medication Sig Dispense Refill  . amLODipine (NORVASC) 5 MG tablet Take 5 mg by mouth daily.    Marland Kitchen aspirin EC 81 MG tablet Take 81 mg by mouth daily.    Marland Kitchen  atorvastatin (LIPITOR) 40 MG tablet Take 40 mg by mouth every evening.     . carvedilol (COREG) 12.5 MG tablet Take 18.75 mg by mouth 2 (two) times daily.    Marland Kitchen Cod Liver Oil CAPS Take 2-3 capsules by mouth 2 (two) times daily.     . cycloSPORINE (RESTASIS) 0.05 % ophthalmic emulsion Place 1 drop into both eyes 2 (two) times daily.    . Flaxseed, Linseed, (FLAX SEED OIL PO) Take 1 capsule by mouth 2 (two) times daily.     . fluticasone (FLONASE) 50 MCG/ACT nasal spray Place 1 spray into both nostrils daily as needed for allergies.    . methocarbamol (ROBAXIN) 500 MG tablet Take 1 tablet (500 mg total) by mouth 3 (three) times daily. 30 tablet 0  . Multiple Vitamin (MULTIVITAMIN WITH MINERALS) TABS tablet Take 1 tablet by mouth daily.    . ondansetron (ZOFRAN ODT) 4 MG disintegrating tablet Take 1 tablet (4 mg total) by mouth every 8 (eight) hours as needed for nausea or vomiting. 20 tablet 0  . oxyCODONE-acetaminophen (PERCOCET) 10-325 MG tablet Take 1 tablet by mouth every 4 (four) hours as needed for pain. 42 tablet 0  . saw palmetto 160 MG capsule Take 160 mg by mouth 2 (two) times daily.    . tamsulosin (FLOMAX) 0.4 MG CAPS capsule Take 0.4 mg by mouth every evening.      Allergies as of 02/16/2017 - Review Complete 02/16/2017  Allergen Reaction Noted  . Hydrocodone-acetaminophen Anxiety 12/30/2013    History reviewed. No pertinent family history.  Social History   Social History  . Marital status: Married    Spouse name: N/A  . Number of children: N/A  . Years of education: N/A   Occupational History  . Not on file.   Social History Main Topics  . Smoking status: Former Smoker    Types: Cigarettes    Quit date: 07/05/1973  . Smokeless tobacco: Never Used  . Alcohol use No  . Drug use: No  . Sexual activity: Not on file   Other Topics Concern  . Not on file   Social History Narrative  . No narrative on file    Review of Systems: Gen: Denies any fever, chills, sweats,  anorexia, fatigue, weakness, malaise, weight loss, and sleep disorder CV: Denies chest pain, angina, palpitations, syncope, orthopnea, PND, peripheral edema, and claudication. Resp: Denies dyspnea at rest, dyspnea with exercise, cough, sputum, wheezing, coughing up blood, and pleurisy. GI: see HPI GU : Denies urinary burning, blood in urine, urinary frequency, urinary hesitancy, nocturnal urination, and urinary incontinence. MS: Denies joint pain, limitation of movement, and swelling, stiffness,extremity pain. Denies muscle weakness, cramps, atrophy. + lower back pain post-op Derm: Denies rash, itching, dry skin, hives, moles, warts, or unhealing ulcers.  Psych: Denies depression, anxiety, memory loss, suicidal ideation,  hallucinations, paranoia, and confusion. Heme: Denies bruising, bleeding, and enlarged lymph nodes. Neuro:  Denies any headaches, dizziness, paresthesias. Endo:  Denies any problems with DM, thyroid, adrenal function.  Physical Exam: Vital signs in last 24 hours: Temp:  [98.9 F (37.2 C)-99.2 F (37.3 C)] 98.9 F (37.2 C) (08/15 2315) Pulse Rate:  [85-87] 87 (08/15 2315) Resp:  [20] 20 (08/15 2111) BP: (134-136)/(79-85) 136/85 (08/15 2315) SpO2:  [98 %-100 %] 98 % (08/15 2315) Weight:  [239 lb (108.4 kg)] 239 lb (108.4 kg) (08/15 2111)   Gen: awake, alert, uncomfortable spitting secretions HEENT: anicteric, op clear CV: RRR, no mrg Pulm: CTA b/l Abd: soft, NT/ND, +BS throughout Ext: no c/c/e Neuro: nonfocal    Intake/Output from previous day: No intake/output data recorded. Intake/Output this shift: No intake/output data recorded.  Lab Results:  Recent Labs  02/16/17 2339  HGB 11.6*  HCT 34.0*   BMET  Recent Labs  02/16/17 2339  NA 137  K 3.9  CL 101  GLUCOSE 123*  BUN 23*  CREATININE 1.10     IMPRESSION:  71 year old male with esophageal food impaction  PLAN: 1. Esophageal food impaction -- emergent bedside upper endoscopy to relieve  esophageal food impaction. The nature of the procedure, as well as the risks, benefits, and alternatives were carefully and thoroughly reviewed with the patient. Ample time for discussion and questions allowed. The patient understood, was satisfied, and agreed to proceed.      [name]  02/16/2017, 11:43 PM

## 2017-02-16 NOTE — ED Triage Notes (Signed)
Pt presents with feeling as something in stuck in his esophagus;  Pt reports eating at hotdog at 1530 this afternoon and it wouldn't go down; pt unable to swallow secretions; denies resp distress or sob; pt also reporting unable to take night doses of BP and pain medications; pt recently had back surgery last week

## 2017-02-16 NOTE — ED Provider Notes (Signed)
[facility] DEPT Provider Note   CSN: [id number] Arrival date & time: 02/16/17  2040     History   Chief Complaint Chief Complaint  Patient presents with  . Foreign Body    HPI Larry Kennedy is a 71 y.o. male.  The history is provided by the patient.  Foreign Body  The current episode started 6 to 12 hours ago. The foreign body is suspected to be swallowed. The foreign body is food. Associated symptoms include trouble swallowing, choking, cough and vomiting. Pertinent negatives include no fever.   Patient presents with food impaction He reports he was eating a hot dog around 3:20pm when he felt it get stuck Since that time he is unable to swallow saliva.  He is also coughing up pieces of hot dog.  He also reports chest discomfort as well.  He reports recent back surgery, no complications but does have post op pain   Past Medical History:  Diagnosis Date  . Enlarged prostate   . Hypertension     Patient Active Problem List   Diagnosis Date Noted  . Status post lumbar discectomy 02/09/2017  . Status post lumbar spine operation 02/09/2017    Past Surgical History:  Procedure Laterality Date  . DG ELBOW RIGHT COMPLETE ([facility] HX)     40 yrs ago  . JOINT REPLACEMENT    . KNEE SURGERY    . LUMBAR LAMINECTOMY/DECOMPRESSION MICRODISCECTOMY N/A 02/09/2017   Procedure: Lumbar decompression L2-S1 with L5-S1 disectomy ;  Surgeon: [name], MD;  Location: [facility];  Service: Orthopedics;  Laterality: N/A;  3 h rs  . TONSILLECTOMY         Home Medications    Prior to Admission medications   Medication Sig Start Date End Date Taking? Authorizing Provider  amLODipine (NORVASC) 5 MG tablet Take 5 mg by mouth daily. 01/03/17   [provider]  aspirin EC 81 MG tablet Take 81 mg by mouth daily.    [provider]  atorvastatin (LIPITOR) 40 MG tablet Take 40 mg by mouth every evening.  11/15/13   [provider]  carvedilol (COREG) 12.5 MG tablet Take  18.75 mg by mouth 2 (two) times daily. 01/03/17   [provider]  Cod Liver Oil CAPS Take 2-3 capsules by mouth 2 (two) times daily.     [provider]  cycloSPORINE (RESTASIS) 0.05 % ophthalmic emulsion Place 1 drop into both eyes 2 (two) times daily.    [provider]  Flaxseed, Linseed, (FLAX SEED OIL PO) Take 1 capsule by mouth 2 (two) times daily.     [provider]  fluticasone (FLONASE) 50 MCG/ACT nasal spray Place 1 spray into both nostrils daily as needed for allergies. 11/02/16   [provider]  methocarbamol (ROBAXIN) 500 MG tablet Take 1 tablet (500 mg total) by mouth 3 (three) times daily. 02/09/17   Mayo, [name], PA-C  Multiple Vitamin (MULTIVITAMIN WITH MINERALS) TABS tablet Take 1 tablet by mouth daily.    [provider]  ondansetron (ZOFRAN ODT) 4 MG disintegrating tablet Take 1 tablet (4 mg total) by mouth every 8 (eight) hours as needed for nausea or vomiting. 02/09/17   Mayo, [name], PA-C  oxyCODONE-acetaminophen (PERCOCET) 10-325 MG tablet Take 1 tablet by mouth every 4 (four) hours as needed for pain. 02/10/17 02/17/18  Mayo, [name], PA-C  saw palmetto 160 MG capsule Take 160 mg by mouth 2 (two) times daily.    [provider]  tamsulosin (FLOMAX) 0.4 MG CAPS capsule Take 0.4 mg by mouth every evening. 12/07/16   [provider]    Family History History reviewed. No pertinent family history.  Social History Social History  Substance Use Topics  . Smoking status: Former Smoker    Types: Cigarettes    Quit date: 07/05/1973  . Smokeless tobacco: Never Used  . Alcohol use No     Allergies   Hydrocodone-acetaminophen   Review of Systems Review of Systems  Constitutional: Negative for fever.  HENT: Positive for trouble swallowing.   Respiratory: Positive for cough and choking.   Gastrointestinal: Positive for vomiting.  All other systems reviewed and are  negative.    Physical Exam Updated Vital Signs BP 136/85   Pulse 87   Temp 98.9 F (37.2 C)   Resp 18   Ht 1.753 m (5\' 9" )   Wt 108.4 kg (239 lb)   SpO2 98%   BMI 35.29 kg/m   Physical Exam CONSTITUTIONAL: Well developed/well nourished HEAD: Normocephalic/atraumatic EYES: EOMI/PERRL ENMT: Mucous membranes moist, uvula midline, no edema noted.  Pt spitting frequently NECK: supple no meningeal signs SPINE/BACK:brace in place CV: S1/S2 noted, no murmurs/rubs/gallops noted LUNGS: Lungs are clear to auscultation bilaterally, no apparent distress ABDOMEN: soft, nontender, GU:no cva tenderness NEURO: Pt is awake/alert/appropriate, moves all extremitiesx4.  No facial droop.   EXTREMITIES: pulses normal/equal, full ROM SKIN: warm, color normal PSYCH: no abnormalities of mood noted, alert and oriented to situation   ED Treatments / Results  Labs (all labs ordered are listed, but only abnormal results are displayed) Labs Reviewed  I-STAT CHEM 8, ED - Abnormal; Notable for the following:       Result Value   BUN 23 (*)    Glucose, Bld 123 (*)    Calcium, Ion 1.12 (*)    Hemoglobin 11.6 (*)    HCT 34.0 (*)    All other components within normal limits    EKG  EKG Interpretation  Date/Time:  Wednesday February 16 2017 23:36:05 EDT Ventricular Rate:  84 PR Interval:    QRS Duration: 84 QT Interval:  362 QTC Calculation: 428 R Axis:   74 Text Interpretation:  Sinus rhythm Interpretation limited secondary to artifact Confirmed by Ripley Fraise [phone]) on 02/16/2017 11:54:42 PM       Radiology Dg Neck Soft Tissue  Result Date: 02/16/2017 CLINICAL DATA:  Sensation of hot dog stuck in throat. Initial encounter. EXAM: NECK SOFT TISSUES - 1+ VIEW COMPARISON:  MRI of the cervical spine performed 03/12/2009 FINDINGS: The nasopharynx, oropharynx and hypopharynx are unremarkable in appearance. The epiglottis is normal in thickness. No radiopaque foreign bodies are seen, though  a piece of hot dog may not be visible on radiograph. Prominent anterior and posterior disc osteophyte complexes are seen along the cervical spine. The visualized lung apices are clear. IMPRESSION: 1. No radiopaque foreign bodies seen, though a piece of hot dog may not be visible on radiograph. 2. Mild degenerative change along the cervical spine. Electronically Signed   By: Garald Balding M.D.   On: 02/16/2017 22:08   Dg Chest 2 View  Result Date: 02/16/2017 CLINICAL DATA:  Patient unable to swallow anything after eating hot dog. Globus sensation. EXAM: CHEST  2 VIEW COMPARISON:  Chest radiograph performed 12/03/2009 FINDINGS: The lungs are well-aerated and clear. There is no evidence of focal opacification, pleural effusion or pneumothorax. The heart is borderline normal in size. No acute osseous abnormalities are seen. Mild anterior bridging osteophytes  are seen along the thoracic spine. No radiopaque foreign bodies are seen, though a piece of hot dog may not be visible on radiograph. IMPRESSION: No acute cardiopulmonary process seen. No radiopaque foreign bodies identified. Electronically Signed   By: Garald Balding M.D.   On: 02/16/2017 22:09    Procedures Procedures (including critical care time)  Medications Ordered in ED Medications  glucagon (human recombinant) (GLUCAGEN) injection 1 mg (not administered)  fentaNYL (SUBLIMAZE) injection 50 mcg (50 mcg Intravenous Given 02/16/17 2353)  ondansetron (ZOFRAN) injection 4 mg (4 mg Intravenous Given 02/16/17 2353)     Initial Impression / Assessment and Plan / ED Course  I have reviewed the triage vital signs and the nursing notes.  Pertinent labs & imaging results that were available during my care of the patient were reviewed by me and considered in my medical decision making (see chart for details).     Pt stable D/w dr Hilarie Fredrickson He will admit patient for emergent EGD Pt agreeable with plan   Final Clinical Impressions(s) / ED  Diagnoses   Final diagnoses:  Food impaction of esophagus, initial encounter    New Prescriptions New Prescriptions   No medications on file     Ripley Fraise, MD 02/17/17 0010

## 2017-02-17 ENCOUNTER — Encounter (HOSPITAL_COMMUNITY)
Admission: EM | Disposition: A | Discharge status: Home / Self Care | Payer: Self-pay | Source: Home / Self Care | Attending: Emergency Medicine

## 2017-02-17 ENCOUNTER — Encounter (HOSPITAL_COMMUNITY): Payer: Self-pay | Admitting: *Deleted

## 2017-02-17 DIAGNOSIS — T18128A Food in esophagus causing other injury, initial encounter: Secondary | ICD-10-CM | POA: Insufficient documentation

## 2017-02-17 DIAGNOSIS — K449 Diaphragmatic hernia without obstruction or gangrene: Secondary | ICD-10-CM | POA: Diagnosis not present

## 2017-02-17 DIAGNOSIS — K222 Esophageal obstruction: Secondary | ICD-10-CM

## 2017-02-17 HISTORY — PX: ESOPHAGOGASTRODUODENOSCOPY: SHX5428

## 2017-02-17 SURGERY — EGD (ESOPHAGOGASTRODUODENOSCOPY)
Anesthesia: Moderate Sedation

## 2017-02-17 MED ORDER — SODIUM CHLORIDE 0.9 % IV SOLN
INTRAVENOUS | Status: DC
  Administered 2017-02-17: 01:00:00 via INTRAVENOUS

## 2017-02-17 MED ORDER — OMEPRAZOLE 20 MG PO CPDR: 20.0000 mg | DELAYED_RELEASE_CAPSULE | Freq: Every day | ORAL | 0 refills | Status: DC

## 2017-02-17 MED ORDER — FENTANYL CITRATE (PF) 100 MCG/2ML IJ SOLN
INTRAMUSCULAR | Status: DC | PRN
  Administered 2017-02-17 (×2): 25 ug via INTRAVENOUS

## 2017-02-17 MED ORDER — BUTAMBEN-TETRACAINE-BENZOCAINE 2-2-14 % EX AERO
INHALATION_SPRAY | CUTANEOUS | Status: DC | PRN
  Administered 2017-02-17: 2 via TOPICAL

## 2017-02-17 MED ORDER — MIDAZOLAM HCL 10 MG/2ML IJ SOLN
INTRAMUSCULAR | Status: DC | PRN
  Administered 2017-02-17 (×2): 2 mg via INTRAVENOUS

## 2017-02-17 MED ORDER — MIDAZOLAM HCL 5 MG/ML IJ SOLN
INTRAMUSCULAR | Status: AC
  Filled 2017-02-17: qty 2

## 2017-02-17 MED ORDER — FENTANYL CITRATE (PF) 100 MCG/2ML IJ SOLN
INTRAMUSCULAR | Status: AC
  Filled 2017-02-17: qty 2

## 2017-02-17 NOTE — Op Note (Addendum)
[facility] Patient Name: Larry Kennedy Procedure Date : 02/17/2017 MRN: [id number] Attending MD: [name] , MD Date of Birth: 1945/11/03 CSN: [id number] Age: 71 Admit Type: Inpatient Procedure:                Upper GI endoscopy Indications:              Foreign body in the esophagus Providers:                Lajuan Lines. Hilarie Fredrickson, MD, Vista Lawman, RN, Alan Mulder,                            Technician Referring MD:             Ripley Fraise, MD Medicines:                Fentanyl 50 micrograms IV, Midazolam 4 mg IV,                            Cetacaine spray Complications:            No immediate complications. Estimated Blood Loss:     Estimated blood loss: none. Procedure:                Pre-Anesthesia Assessment:                           - Prior to the procedure, a History and Physical                            was performed, and patient medications and                            allergies were reviewed. The patient's tolerance of                            previous anesthesia was also reviewed. The risks                            and benefits of the procedure and the sedation                            options and risks were discussed with the patient.                            All questions were answered, and informed consent                            was obtained. Prior Anticoagulants: The patient has                            taken no previous anticoagulant or antiplatelet                            agents. ASA Grade Assessment: II - A patient with  mild systemic disease. After reviewing the risks                            and benefits, the patient was deemed in                            satisfactory condition to undergo the procedure.                           After obtaining informed consent, the endoscope was                            passed under direct vision. Throughout the                            procedure, the  patient's blood pressure, pulse, and                            oxygen saturations were monitored continuously. The                            EG-[id number] ([id number]) scope was introduced through the                            mouth, and advanced to the second part of duodenum.                            The upper GI endoscopy was accomplished without                            difficulty. The patient tolerated the procedure                            well. Scope In: Scope Out: Findings:      Food was found in the middle third of the esophagus and in the lower       third of the esophagus. Removal of food was accomplished by gently       advancing the scope around the esophageal food impaction and passing the       scope into the stomach. Once this was done the remaining esophageal food       debris was carefully advanced into the stomach until the esophagus was       clear.      One moderate (circumferential scarring or stenosis; an endoscope may       pass) benign-appearing, intrinsic stenosis was found 38 cm from the       incisors. This measured less than one cm (in length) and was traversed.       At the stricture there was mild esophagitis due to recent food impaction.      A 4 cm hiatal hernia was present (38 cm to 42 cm).      Food (residue) was found in the gastric fundus and in the gastric body       which obscured views of the proximal stomach.      The exam of the stomach was otherwise normal.  The examined duodenum was normal. Impression:               - Food in the middle third of the esophagus and in                            the lower third of the esophagus. Removal was                            successful.                           - Benign-appearing esophageal stenosis.                           - 4 cm hiatal hernia.                           - Food (residue) in the stomach.                           - Normal examined duodenum. Moderate Sedation:      Moderate  (conscious) sedation was administered by the endoscopy nurse       and supervised by the endoscopist. The following parameters were       monitored: oxygen saturation, heart rate, blood pressure, and response       to care. Total physician intraservice time was 7 minutes. Recommendation:           - Patient has a contact number available for                            emergencies. The signs and symptoms of potential                            delayed complications were discussed with the                            patient. Return to normal activities tomorrow.                            Written discharge instructions were provided to the                            patient.                           - Soft diet.                           - Continue present medications.                           - Would add omeprazole 20 mg daily given                            esophagitis seen today                           -  Repeat upper endoscopy is recommended as an                            outpatient for dilation of esophageal stricture                            which should help prevent future food impaction. Procedure Code(s):        --- Professional ---                           [phone], Esophagogastroduodenoscopy, flexible,                            transoral; with removal of foreign body(s) Diagnosis Code(s):        --- Professional ---                           X90.240X, Food in esophagus causing other injury,                            initial encounter                           K22.2, Esophageal obstruction                           K44.9, Diaphragmatic hernia without obstruction or                            gangrene                           T18.2XXA, Foreign body in stomach, initial encounter                           T18.108A, Unspecified foreign body in esophagus                            causing other injury, initial encounter CPT copyright 2016 [employer]. All  rights reserved. The codes documented in this report are preliminary and upon coder review may  be revised to meet current compliance requirements. [name], MD 02/17/2017 1:23:40 AM This report has been signed electronically. Number of Addenda: 0

## 2017-02-18 ENCOUNTER — Encounter (HOSPITAL_COMMUNITY): Payer: Self-pay | Admitting: Internal Medicine

## 2017-02-21 ENCOUNTER — Telehealth: Payer: Self-pay | Admitting: Internal Medicine

## 2017-02-21 NOTE — Telephone Encounter (Signed)
Pt scheduled for previsit 03/18/17@2pm , EGD with dil scheduled in the [facility] 03/28/17@10am . Pt aware of appts.

## 2017-02-24 NOTE — Progress Notes (Signed)
Late entry for missed gcode    02/10/17 0928  OT Time Calculation  OT Start Time (ACUTE ONLY) 0842  OT Stop Time (ACUTE ONLY) 0901  OT Time Calculation (min) 19 min  OT G-codes **NOT FOR INPATIENT CLASS**  Functional Assessment Tool Used Clinical judgement  Functional Limitation Self care  Self Care Current Status (B7169) CI  Self Care Goal Status (C7893) CI  Self Care Discharge Status (Y1017) CI  OT General Charges  $OT Visit 1 Procedure  OT Evaluation  $OT Eval Moderate Complexity 1 Procedure    Yamin Swingler A. Ulice Brilliant, M.S., OTR/L Pager: [phone]

## 2017-03-10 NOTE — Progress Notes (Addendum)
Physical Therapy Evaluation Addendum for G-Codes    February 17, 2017 1432  PT G-Codes **NOT FOR INPATIENT CLASS**  Functional Assessment Tool Used Clinical judgement  Functional Limitation Mobility: Walking and moving around  Mobility: Walking and Moving Around Current Status (F7510) CJ  Mobility: Walking and Moving Around Goal Status (C5852) CI   Rolinda Roan, PT, DPT Acute Rehabilitation Services Pager: [phone]

## 2017-03-23 ENCOUNTER — Ambulatory Visit (AMBULATORY_SURGERY_CENTER): Payer: Self-pay

## 2017-03-23 ENCOUNTER — Encounter: Payer: Self-pay | Admitting: Internal Medicine

## 2017-03-23 VITALS — Ht 69.0 in | Wt 235.0 lb

## 2017-03-23 DIAGNOSIS — T18128D Food in esophagus causing other injury, subsequent encounter: Secondary | ICD-10-CM

## 2017-03-23 DIAGNOSIS — W44F3XD Food entering into or through a natural orifice, subsequent encounter: Secondary | ICD-10-CM

## 2017-03-23 NOTE — Progress Notes (Signed)
No allergies to eggs or soy No diet meds No home oxygen No past problems with anesthesia  Declined emmi 

## 2017-03-28 ENCOUNTER — Encounter: Payer: Self-pay | Admitting: Internal Medicine

## 2017-03-28 ENCOUNTER — Ambulatory Visit (AMBULATORY_SURGERY_CENTER): Payer: Medicare Other | Admitting: Internal Medicine

## 2017-03-28 VITALS — BP 106/51 | HR 73 | Temp 98.6°F | Resp 15 | Ht 69.0 in | Wt 235.0 lb

## 2017-03-28 DIAGNOSIS — K222 Esophageal obstruction: Secondary | ICD-10-CM

## 2017-03-28 DIAGNOSIS — T18128D Food in esophagus causing other injury, subsequent encounter: Secondary | ICD-10-CM

## 2017-03-28 MED ORDER — SODIUM CHLORIDE 0.9 % IV SOLN: 500.0000 mL | INTRAVENOUS | Status: DC

## 2017-03-28 MED ORDER — FLUCONAZOLE 100 MG PO TABS: 100.0000 mg | ORAL_TABLET | Freq: Every day | ORAL | 0 refills | Status: DC

## 2017-03-28 MED ORDER — OMEPRAZOLE 20 MG PO CPDR: 20.0000 mg | DELAYED_RELEASE_CAPSULE | Freq: Every day | ORAL | 0 refills | Status: DC

## 2017-03-28 NOTE — Op Note (Signed)
[facility] Patient Name: Reader Ducre Procedure Date: 03/28/2017 9:03 AM MRN: [id number] Endoscopist: [name] , MD Age: 71 Referring MD:  Date of Birth: 1945-09-13 Gender: Male Account #: [id number] Procedure:                Upper GI endoscopy Indications:              Dysphagia, prior esophageal food impaction (EGD in                            emergency room 02/17/2017) Medicines:                Monitored Anesthesia Care Procedure:                Pre-Anesthesia Assessment:                           - Prior to the procedure, a History and Physical                            was performed, and patient medications and                            allergies were reviewed. The patient's tolerance of                            previous anesthesia was also reviewed. The risks                            and benefits of the procedure and the sedation                            options and risks were discussed with the patient.                            All questions were answered, and informed consent                            was obtained. Prior Anticoagulants: The patient has                            taken no previous anticoagulant or antiplatelet                            agents. ASA Grade Assessment: II - A patient with                            mild systemic disease. After reviewing the risks                            and benefits, the patient was deemed in                            satisfactory condition to undergo the procedure.  After obtaining informed consent, the endoscope was                            passed under direct vision. Throughout the                            procedure, the patient's blood pressure, pulse, and                            oxygen saturations were monitored continuously. The                            Model [id number]-[id number] scope was introduced                            through the mouth, and advanced  to the second part                            of duodenum. The upper GI endoscopy was                            accomplished without difficulty. The patient                            tolerated the procedure well. Scope In: Scope Out: Findings:                 Patchy candidiasis was found in the upper third of                            the esophagus and in the middle third of the                            esophagus.                           One mild benign-appearing, intrinsic stenosis was                            found 38 cm from the incisors and was traversed.                            This narrowing was much more subtle than previously                            (at time of food impaction). No reflux esophagitis                            seen. Z-line slightly variable, but did not involve                            mucosa above the gastric folds. A TTS dilator was  passed through the scope. Dilation with an 18-19-20                            mm balloon dilator was performed to 19 mm and this                            moved freely in the distal esophagus across the GE                            junction.                           A 1 cm hiatal hernia was present.                           The entire examined stomach was normal.                           The examined duodenum was normal. Complications:            No immediate complications. Estimated Blood Loss:     Estimated blood loss: none. Impression:               - Monilial esophagitis.                           - Benign-appearing esophageal stenosis. Dilated to                            19 mm.                           - 1 cm hiatal hernia.                           - Normal stomach.                           - Normal examined duodenum.                           - No specimens collected. Recommendation:           - Patient has a contact number available for                             emergencies. The signs and symptoms of potential                            delayed complications were discussed with the                            patient. Return to normal activities tomorrow.                            Written discharge instructions were provided to the  patient.                           - Resume previous diet.                           - Continue present medications and resume                            omeprazole 20 mg daily.                           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                           - If swallowing trouble persists, please contact my                            office for follow-up. [name], MD 03/28/2017 9:42:40 AM This report has been signed electronically.

## 2017-03-28 NOTE — Progress Notes (Signed)
Called to room to assist during endoscopic procedure.  Patient ID and intended procedure confirmed with present staff. Received instructions for my participation in the procedure from the performing physician.  

## 2017-03-28 NOTE — Patient Instructions (Signed)
**   Resume previous diet today **  Fluconazole 200mg  x1 day, then 100mg  x 13 days -- Sent to [facility]. New omeprazole prescription called into your mail order pharmacy as well.  If swallowing trouble persists, please contact our office.   YOU HAD AN ENDOSCOPIC PROCEDURE TODAY AT [facility]:   Refer to the procedure report that was given to you for any specific questions about what was found during the examination.  If the procedure report does not answer your questions, please call your gastroenterologist to clarify.  If you requested that your care partner not be given the details of your procedure findings, then the procedure report has been included in a sealed envelope for you to review at your convenience later.  YOU SHOULD EXPECT: Some feelings of bloating in the abdomen. Passage of more gas than usual.  Walking can help get rid of the air that was put into your GI tract during the procedure and reduce the bloating. If you had a lower endoscopy (such as a colonoscopy or flexible sigmoidoscopy) you may notice spotting of blood in your stool or on the toilet paper. If you underwent a bowel prep for your procedure, you may not have a normal bowel movement for a few days.  Please Note:  You might notice some irritation and congestion in your nose or some drainage.  This is from the oxygen used during your procedure.  There is no need for concern and it should clear up in a day or so.  SYMPTOMS TO REPORT IMMEDIATELY:   Following upper endoscopy (EGD)  Vomiting of blood or coffee ground material  New chest pain or pain under the shoulder blades  Painful or persistently difficult swallowing  New shortness of breath  Fever of 100F or higher  Black, tarry-looking stools  For urgent or emergent issues, a gastroenterologist can be reached at any hour by calling [phone].   DIET:  We do recommend a small meal at first, but then you may proceed to your regular diet.   Drink plenty of fluids but you should avoid alcoholic beverages for 24 hours.  ACTIVITY:  You should plan to take it easy for the rest of today and you should NOT DRIVE or use heavy machinery until tomorrow (because of the sedation medicines used during the test).    FOLLOW UP: Our staff will call the number listed on your records the next business day following your procedure to check on you and address any questions or concerns that you may have regarding the information given to you following your procedure. If we do not reach you, we will leave a message.  However, if you are feeling well and you are not experiencing any problems, there is no need to return our call.  We will assume that you have returned to your regular daily activities without incident.  If any biopsies were taken you will be contacted by phone or by letter within the next 1-3 weeks.  Please call us at [phone] if you have not heard about the biopsies in 3 weeks.    SIGNATURES/CONFIDENTIALITY: You and/or your care partner have signed paperwork which will be entered into your electronic medical record.  These signatures attest to the fact that that the information above on your After Visit Summary has been reviewed and is understood.  Full responsibility of the confidentiality of this discharge information lies with you and/or your care-partner.

## 2017-03-28 NOTE — Progress Notes (Signed)
Report to PACU, RN, vss, BBS= Clear.  

## 2017-03-28 NOTE — Progress Notes (Signed)
Pt's states no medical or surgical changes since previsit or office visit. 

## 2017-03-29 ENCOUNTER — Telehealth: Payer: Self-pay

## 2017-03-29 NOTE — Telephone Encounter (Signed)
  Follow up Call-  Call back number 03/28/2017  Post procedure Call Back phone  # [phone]  Permission to leave phone message Yes  Some recent data might be hidden     Patient questions:  Do you have a fever, pain , or abdominal swelling? No. Pain Score  0 *  Have you tolerated food without any problems? Yes.    Have you been able to return to your normal activities? Yes.    Do you have any questions about your discharge instructions: Diet   No. Medications  No. Follow up visit  No.  Do you have questions or concerns about your Care? No.  Actions: * If pain score is 4 or above: No action needed, pain <4.  No problems noted per pt. maw

## 2017-03-31 ENCOUNTER — Telehealth: Payer: Self-pay | Admitting: Internal Medicine

## 2017-03-31 MED ORDER — OMEPRAZOLE 20 MG PO CPDR
20.0000 mg | DELAYED_RELEASE_CAPSULE | Freq: Every day | ORAL | 3 refills | Status: DC
Start: 2017-03-31 — End: 2018-02-13

## 2017-03-31 MED ORDER — OMEPRAZOLE 20 MG PO CPDR
20.0000 mg | DELAYED_RELEASE_CAPSULE | Freq: Every day | ORAL | 3 refills | Status: DC
Start: 2017-03-31 — End: 2017-03-31

## 2017-03-31 NOTE — Addendum Note (Signed)
Addended by: Larina Bras on: 03/31/2017 03:19 PM   Modules accepted: Orders

## 2017-03-31 NOTE — Telephone Encounter (Signed)
I have spoken to patient to advise that it does not appear omeprazole was ever sent to [facility] but rather was sent to local pharmacy. He was ok with me sending electronically to [facility] while he was on phone. Rx sent and he will call me for any further issues with this rx.

## 2018-02-13 ENCOUNTER — Other Ambulatory Visit: Payer: Self-pay | Admitting: Internal Medicine

## 2018-04-10 ENCOUNTER — Other Ambulatory Visit: Payer: Self-pay | Admitting: Internal Medicine

## 2018-06-09 IMAGING — CR DG LUMBAR SPINE 2-3V
2 series · 2 of 2 positions shown · non-contrast
Comparison: 12/30/2013

CLINICAL DATA: Intraoperative localization for decompression

EXAM:
LUMBAR SPINE - 2-3 VIEW

[lateral (1 of 2)]
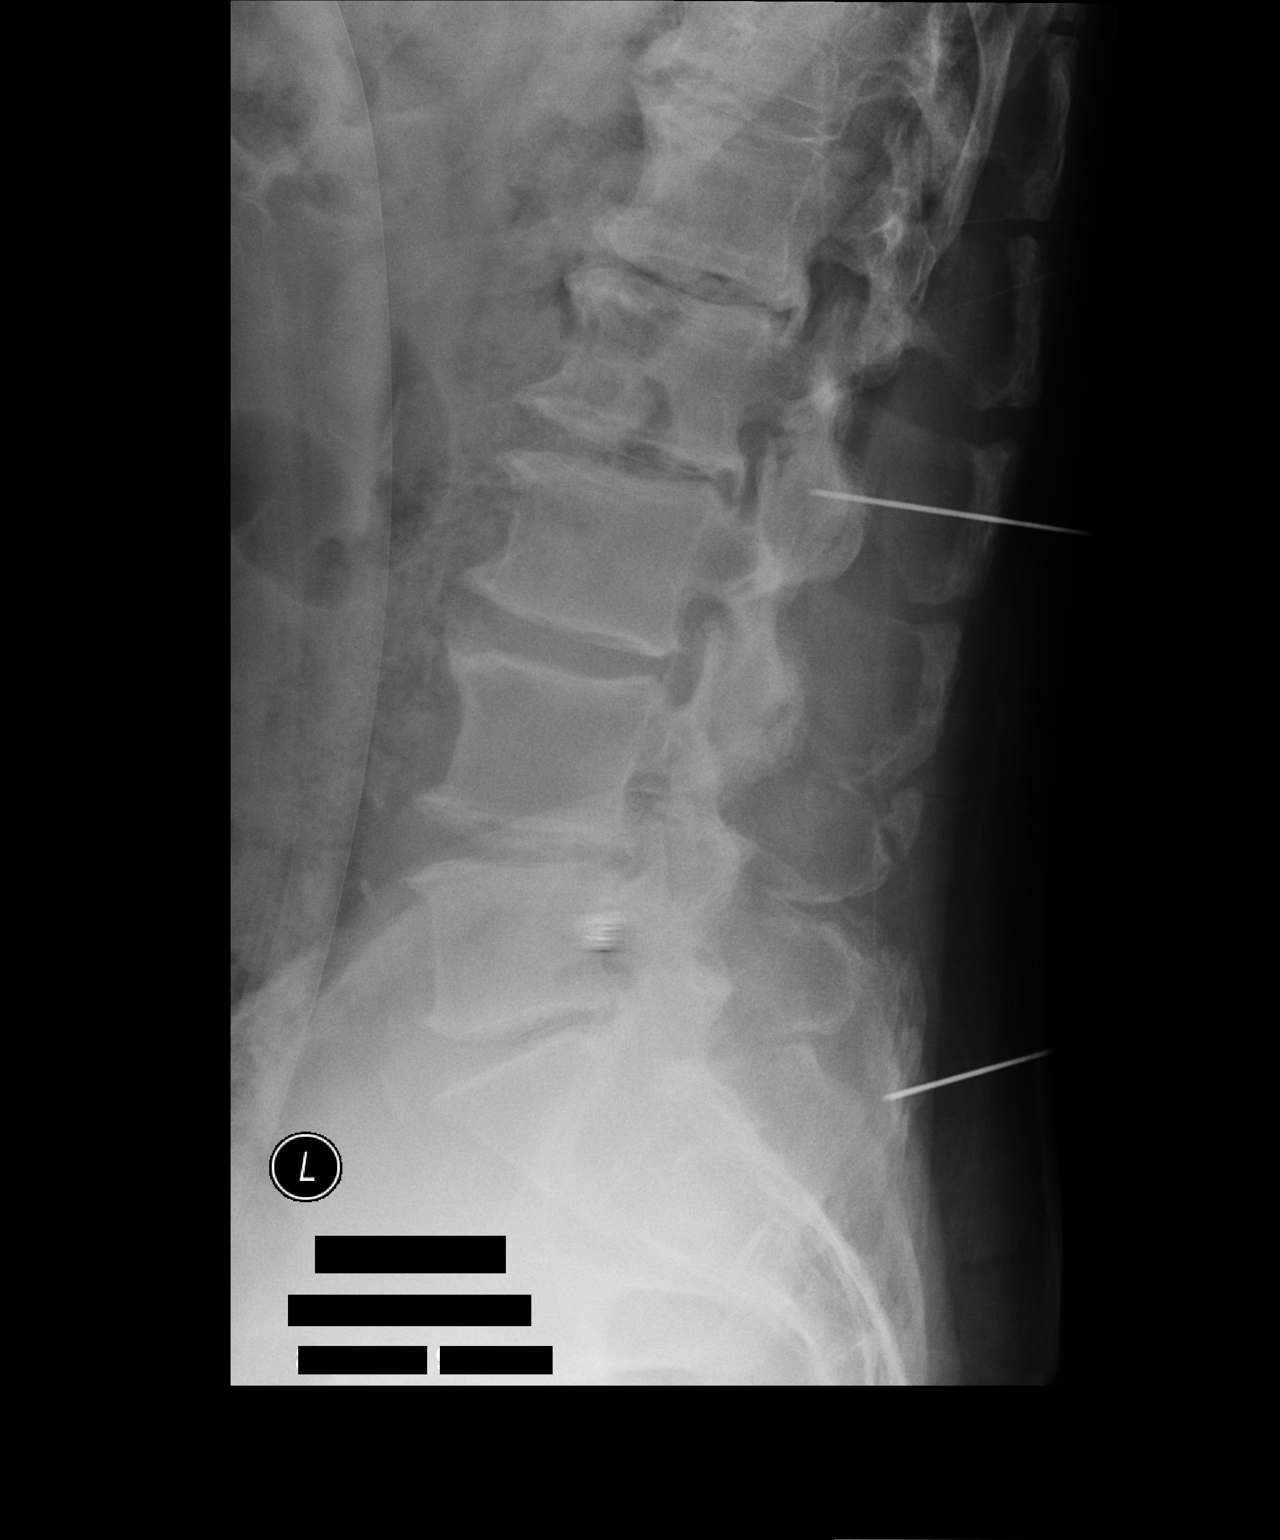

[lateral (2 of 2)]
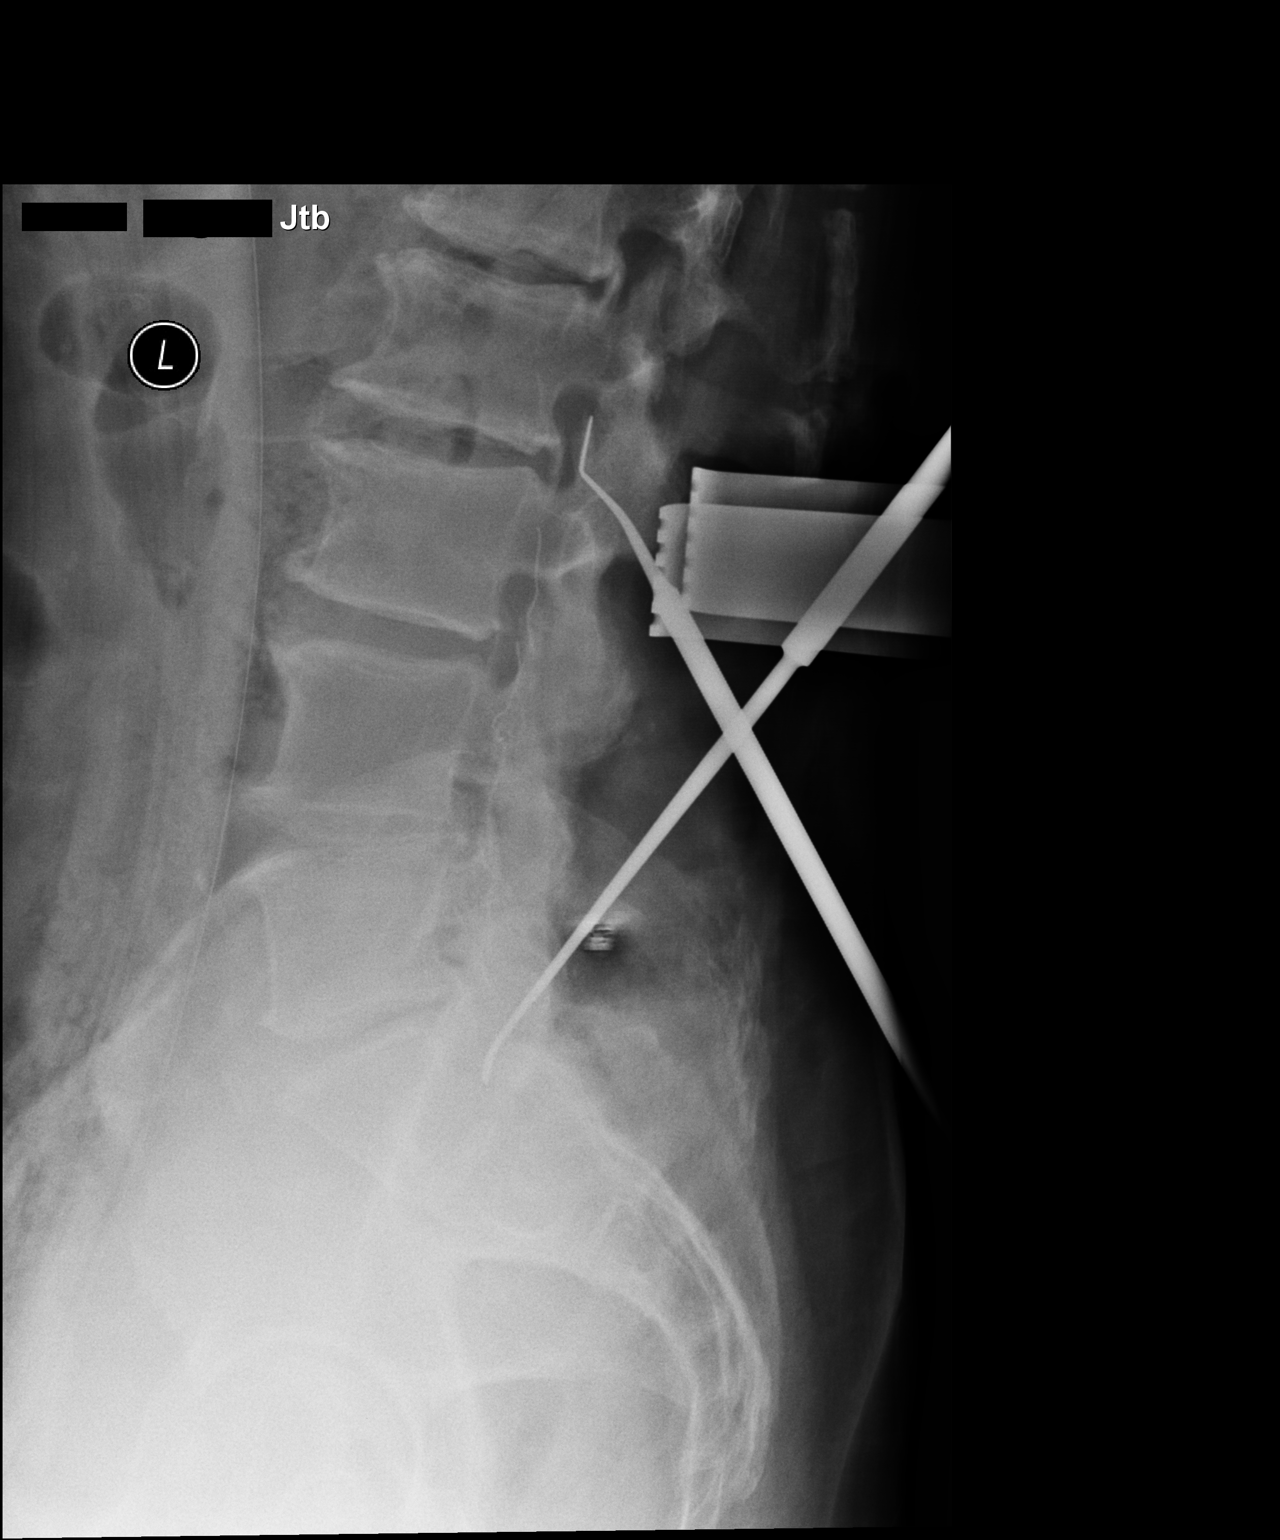

[2 of 2 positions shown; findings below may reference images not displayed]

FINDINGS: The initial lateral image demonstrates needles within the posterior
soft tissues at the L2-3 level and Just below the L5-S1 interspace.
A subsequent film obtained later in the procedure shows surgical
instruments in the spinal canal at the L2-3 and S1 levels
IMPRESSION: Intraoperative lumbar localization.

## 2018-06-09 IMAGING — CR DG SPINE 1V PORT
1 series · 1 of 1 positions shown · non-contrast
Comparison: CT abdomen and pelvis 12/30/2013.

CLINICAL DATA: L2-S1 decompression.  L5-S1 discectomy.

EXAM:
PORTABLE SPINE - 1 VIEW

[lateral]
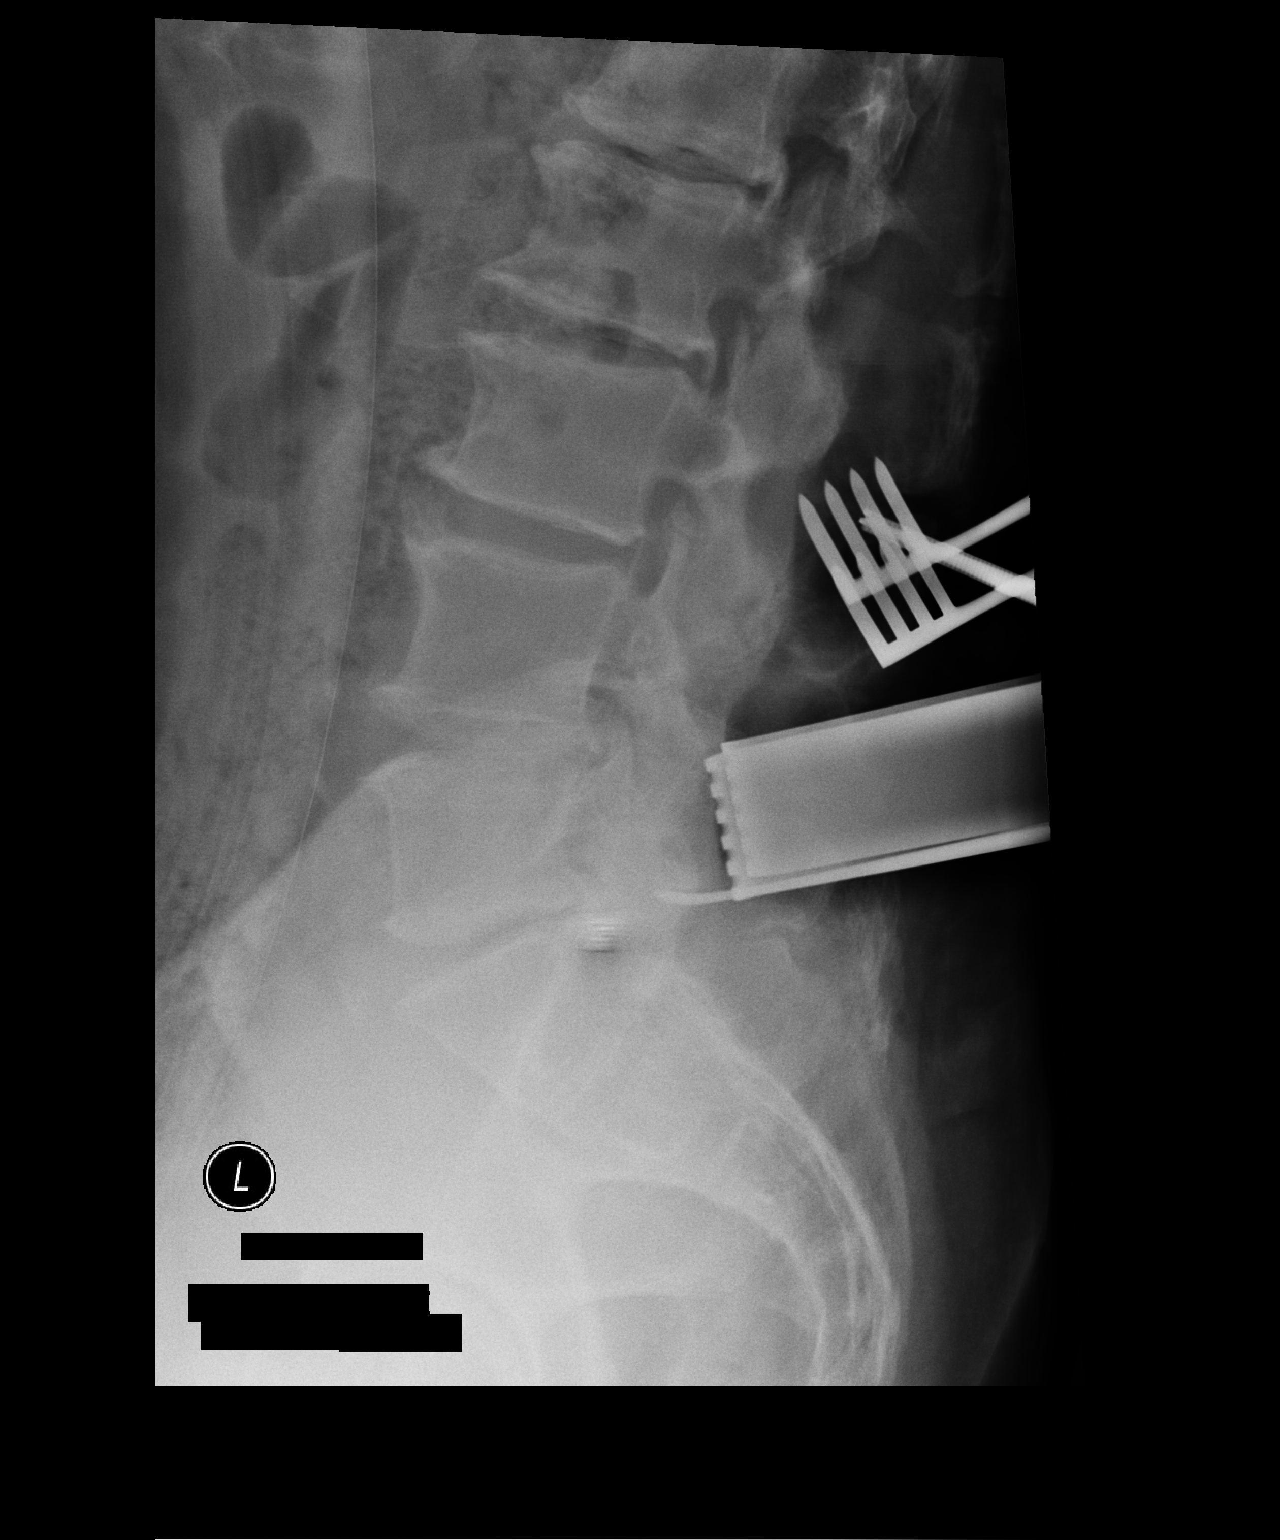

[1 of 1 positions shown; findings below may reference images not displayed]

FINDINGS: A single lateral view of the lumbar spine is submitted. The inferior
surgical probe is directed at the L5-S1 level. The superior clamp is
at the L3 level.
IMPRESSION: 1. Intraoperative localization of the L3 spinous process.
2. Intraoperative localization of the L5-S1 lumbar disc.
These results were called by telephone at the time of interpretation
on 02/09/2017 at [DATE] to Dr. Rudi Jumper OR nurse , who
verbally acknowledged these results.

## 2021-10-12 LAB — COLOGUARD: Cologuard: POSITIVE — AB

## 2021-11-17 ENCOUNTER — Encounter: Payer: Self-pay | Admitting: Internal Medicine

## 2021-12-09 ENCOUNTER — Encounter: Payer: Self-pay | Admitting: Physician Assistant

## 2021-12-09 ENCOUNTER — Ambulatory Visit: Payer: Medicare Other | Admitting: Physician Assistant

## 2021-12-09 VITALS — BP 144/88 | HR 76 | Ht 70.0 in | Wt 241.6 lb

## 2021-12-09 DIAGNOSIS — R195 Other fecal abnormalities: Secondary | ICD-10-CM

## 2021-12-09 DIAGNOSIS — K5900 Constipation, unspecified: Secondary | ICD-10-CM

## 2021-12-09 MED ORDER — NA SULFATE-K SULFATE-MG SULF 17.5-3.13-1.6 GM/177ML PO SOLN: 1.0000 | Freq: Once | ORAL | 0 refills | Status: AC

## 2021-12-09 NOTE — Patient Instructions (Addendum)
Start Miralax 1 capful daily in 8 ounces of liquid.  You have been scheduled for a colonoscopy. Please follow written instructions given to you at your visit today.  Please pick up your prep supplies at the pharmacy within the next 1-3 days. If you use inhalers (even only as needed), please bring them with you on the day of your procedure.  If you are age 76 or older, your body mass index should be between 23-30. Your Body mass index is 34.67 kg/m. If this is out of the aforementioned range listed, please consider follow up with your Primary Care Provider.  If you are age 12 or younger, your body mass index should be between 19-25. Your Body mass index is 34.67 kg/m. If this is out of the aformentioned range listed, please consider follow up with your Primary Care Provider.   ________________________________________________________  The [facility] providers would like to encourage you to use [facility] to communicate with providers for non-urgent requests or questions.  Due to long hold times on the telephone, sending your provider a message by [facility] may be a faster and more efficient way to get a response.  Please allow 48 business hours for a response.  Please remember that this is for non-urgent requests.  _______________________________________________________

## 2021-12-09 NOTE — Progress Notes (Signed)
Chief Complaint: Positive Cologuard  HPI:    Larry Kennedy is a 76 year old male with a past medical history as listed below, known to Dr. Hilarie Fredrickson, who was referred to me by Josefa Half* for a complaint of positive Cologuard.      09/12/2013 colonoscopy-noted in [place] with [facility] though I cannot see actual report.    03/28/2017 EGD with monilial esophagitis and benign-appearing esophageal stenosis dilated to 19 mm as well as 1 cm hiatal hernia.    10/02/2021 CMP with minimally elevated ALT at 51.  CBC normal.    10/12/2021 Cologuard positive.    Today, the patient tells me that he is doing okay other than "failing a test".  Tells me that he has noticed a change towards constipation over the past few months and initially was using senna and then changed to MiraLAX daily.  As long as he takes this medication he seems to go okay but this was a change for him.  Otherwise no GI complaints or concerns.    Denies fever, chills, weight loss, blood in his stool, abdominal pain, nausea or vomiting.  Past Medical History:  Diagnosis Date   Enlarged prostate    Hypertension     Past Surgical History:  Procedure Laterality Date   DG ELBOW RIGHT COMPLETE ([place] HX)     40 yrs ago   ESOPHAGOGASTRODUODENOSCOPY N/A 02/17/2017   Procedure: ESOPHAGOGASTRODUODENOSCOPY (EGD);  Surgeon: [name], MD;  Location: [facility] ENDOSCOPY;  Service: Gastroenterology;  Laterality: N/A;   JOINT REPLACEMENT     KNEE SURGERY     LUMBAR LAMINECTOMY/DECOMPRESSION MICRODISCECTOMY N/A 02/09/2017   Procedure: Lumbar decompression L2-S1 with L5-S1 disectomy ;  Surgeon: [name], MD;  Location: [facility];  Service: Orthopedics;  Laterality: N/A;  3 h rs   TONSILLECTOMY      Current Outpatient Medications  Medication Sig Dispense Refill   amLODipine (NORVASC) 5 MG tablet Take 5 mg by mouth daily.     aspirin EC 81 MG tablet Take 81 mg by mouth daily.     atorvastatin (LIPITOR) 40 MG tablet Take 40 mg by  mouth every evening.      carvedilol (COREG) 12.5 MG tablet Take 18.75 mg by mouth 2 (two) times daily.     Cod Liver Oil CAPS Take 2-3 capsules by mouth 2 (two) times daily.      cycloSPORINE (RESTASIS) 0.05 % ophthalmic emulsion Place 1 drop into both eyes 2 (two) times daily.     Flaxseed, Linseed, (FLAX SEED OIL PO) Take 1 capsule by mouth 2 (two) times daily.      fluconazole (DIFLUCAN) 100 MG tablet Take 1 tablet (100 mg total) by mouth daily. Take two tablets by mouth x1day, then one tablet by mouth x13days 15 tablet 0   fluticasone (FLONASE) 50 MCG/ACT nasal spray Place 1 spray into both nostrils daily as needed for allergies.     methocarbamol (ROBAXIN) 500 MG tablet Take 1 tablet (500 mg total) by mouth 3 (three) times daily. 30 tablet 0   Multiple Vitamin (MULTIVITAMIN WITH MINERALS) TABS tablet Take 1 tablet by mouth daily.     omeprazole (PRILOSEC) 20 MG capsule TAKE 1 CAPSULE BY MOUTH  DAILY 90 capsule 0   ondansetron (ZOFRAN ODT) 4 MG disintegrating tablet Take 1 tablet (4 mg total) by mouth every 8 (eight) hours as needed for nausea or vomiting. 20 tablet 0   saw palmetto 160 MG capsule Take 160 mg by mouth 2 (two)  times daily.     tamsulosin (FLOMAX) 0.4 MG CAPS capsule Take 0.4 mg by mouth every evening.     No current facility-administered medications for this visit.    Allergies as of 12/09/2021 - Review Complete 03/28/2017  Allergen Reaction Noted   Hydrocodone-acetaminophen Anxiety 12/30/2013    Family History  Problem Relation Age of Onset   Esophageal cancer Neg Hx    Pancreatic cancer Neg Hx    Colon cancer Neg Hx    Stomach cancer Neg Hx     Social History   Socioeconomic History   Marital status: Married    Spouse name: Not on file   Number of children: Not on file   Years of education: Not on file   Highest education level: Not on file  Occupational History   Not on file  Tobacco Use   Smoking status: Former    Types: Cigarettes    Quit date:  07/05/1973    Years since quitting: 48.4   Smokeless tobacco: Never  Vaping Use   Vaping Use: Never used  Substance and Sexual Activity   Alcohol use: No   Drug use: No   Sexual activity: Not on file  Other Topics Concern   Not on file  Social History Narrative   Not on file   Social Determinants of Health   Financial Resource Strain: Not on file  Food Insecurity: Not on file  Transportation Needs: Not on file  Physical Activity: Not on file  Stress: Not on file  Social Connections: Not on file  Intimate Partner Violence: Not on file    Review of Systems:    Constitutional: No weight loss, fever or chills Skin: No rash  Cardiovascular: No chest pain Respiratory: No SOB  Gastrointestinal: See HPI and otherwise negative Genitourinary: No dysuria  Neurological: No headache, dizziness or syncope Musculoskeletal: No new muscle or joint pain Hematologic: No bleeding  Psychiatric: No history of depression or anxiety   Physical Exam:  Vital signs: BP (!) 144/88   Pulse 76   Ht '5\' 10"'$  (1.778 m)   Wt 241 lb 9.6 oz (109.6 kg)   BMI 34.67 kg/m    Constitutional:   Very Pleasant Caucasian male appears to be in NAD, Well developed, Well nourished, alert and cooperative Head:  Normocephalic and atraumatic. Eyes:   PEERL, EOMI. No icterus. Conjunctiva pink. Ears:  Normal auditory acuity. Neck:  Supple Throat: Oral cavity and pharynx without inflammation, swelling or lesion.  Respiratory: Respirations even and unlabored. Lungs clear to auscultation bilaterally.   No wheezes, crackles, or rhonchi.  Cardiovascular: Normal S1, S2. No MRG. Regular rate and rhythm. No peripheral edema, cyanosis or pallor.  Gastrointestinal:  Soft, nondistended, nontender. No rebound or guarding. Normal bowel sounds. No appreciable masses or hepatomegaly. Rectal:  Not performed.  Msk:  Symmetrical without gross deformities. Without edema, no deformity or joint abnormality.  Neurologic:  Alert and   oriented x4;  grossly normal neurologically.  Skin:   Dry and intact without significant lesions or rashes. Psychiatric: Demonstrates good judgement and reason without abnormal affect or behaviors.  See HPI for recent labs.  Assessment: 1.  Positive Cologuard: Recently positive Cologuard, last colonoscopy in 2015 was normal per the patient 2.  Constipation: New for the patient over the past couple of months, relieved with daily MiraLAX; consider age related versus medication side effect versus other  Plan: 1.  Scheduled patient for diagnostic colonoscopy in the [place] with Dr. Hilarie Fredrickson given positive Cologuard.  Did provide the patient a detailed list of risks for the procedure and he agrees to proceed. Patient is appropriate for endoscopic procedure(s) in the ambulatory ([place]) setting.  Patient will have a 2-day bowel prep given history of constipation. 2.  Recommend the patient continue his daily MiraLAX as this is working well for his constipation. 3.  Patient to follow in clinic per recommendations after time of procedure.  Ellouise Newer, PA-C [facility] 12/09/2021, 10:13 AM  Cc: Josefa Half*

## 2021-12-14 ENCOUNTER — Ambulatory Visit (AMBULATORY_SURGERY_CENTER): Payer: Medicare Other | Admitting: Internal Medicine

## 2021-12-14 ENCOUNTER — Encounter: Payer: Self-pay | Admitting: Internal Medicine

## 2021-12-14 VITALS — BP 126/47 | HR 61 | Temp 98.1°F | Resp 13 | Ht 70.0 in | Wt 241.0 lb

## 2021-12-14 DIAGNOSIS — D122 Benign neoplasm of ascending colon: Secondary | ICD-10-CM

## 2021-12-14 DIAGNOSIS — K5730 Diverticulosis of large intestine without perforation or abscess without bleeding: Secondary | ICD-10-CM | POA: Diagnosis not present

## 2021-12-14 DIAGNOSIS — D120 Benign neoplasm of cecum: Secondary | ICD-10-CM

## 2021-12-14 DIAGNOSIS — R195 Other fecal abnormalities: Secondary | ICD-10-CM

## 2021-12-14 DIAGNOSIS — D123 Benign neoplasm of transverse colon: Secondary | ICD-10-CM | POA: Diagnosis not present

## 2021-12-14 DIAGNOSIS — K648 Other hemorrhoids: Secondary | ICD-10-CM

## 2021-12-14 MED ORDER — SODIUM CHLORIDE 0.9 % IV SOLN: 500.0000 mL | Freq: Once | INTRAVENOUS | Status: DC

## 2021-12-14 NOTE — Progress Notes (Signed)
Called to room to assist during endoscopic procedure.  Patient ID and intended procedure confirmed with present staff. Received instructions for my participation in the procedure from the performing physician.  

## 2021-12-14 NOTE — Op Note (Signed)
[facility] Patient Name: Fabio Wah Procedure Date: 12/14/2021 1:26 PM MRN: [id number] Endoscopist: [name] , MD Age: 76 Referring MD:  Date of Birth: 1946-01-11 Gender: Male Account #: [id number] Procedure:                Colonoscopy Indications:              Positive Cologuard test Medicines:                Monitored Anesthesia Care Procedure:                Pre-Anesthesia Assessment:                           - Prior to the procedure, a History and Physical                            was performed, and patient medications and                            allergies were reviewed. The patient's tolerance of                            previous anesthesia was also reviewed. The risks                            and benefits of the procedure and the sedation                            options and risks were discussed with the patient.                            All questions were answered, and informed consent                            was obtained. Prior Anticoagulants: The patient has                            taken no previous anticoagulant or antiplatelet                            agents. ASA Grade Assessment: II - A patient with                            mild systemic disease. After reviewing the risks                            and benefits, the patient was deemed in                            satisfactory condition to undergo the procedure.                           After obtaining informed consent, the colonoscope  was passed under direct vision. Throughout the                            procedure, the patient's blood pressure, pulse, and                            oxygen saturations were monitored continuously. The                            CF [id number] #[id number] was introduced through the anus                            and advanced to the cecum, identified by                            appendiceal orifice and ileocecal valve. The                             colonoscopy was performed without difficulty. The                            patient tolerated the procedure well. The quality                            of the bowel preparation was good. The ileocecal                            valve, appendiceal orifice, and rectum were                            photographed. Scope In: 1:41:58 PM Scope Out: 2:01:11 PM Scope Withdrawal Time: 0 hours 14 minutes 22 seconds  Total Procedure Duration: 0 hours 19 minutes 13 seconds  Findings:                 The digital rectal exam was normal.                           Two sessile polyps were found in the cecum. The                            polyps were 5 to 6 mm in size. These polyps were                            removed with a cold snare. Resection and retrieval                            were complete.                           Two sessile polyps were found in the ascending                            colon. The polyps were 6 to 8 mm in size. These  polyps were removed with a cold snare. Resection                            and retrieval were complete.                           A 6 mm polyp was found in the transverse colon. The                            polyp was sessile. The polyp was removed with a                            cold snare. Resection and retrieval were complete.                           Multiple small-mouthed diverticula were found in                            the sigmoid colon.                           Internal hemorrhoids were found during                            retroflexion. The hemorrhoids were medium-sized. Complications:            No immediate complications. Estimated Blood Loss:     Estimated blood loss was minimal. Impression:               - Two 5 to 6 mm polyps in the cecum, removed with a                            cold snare. Resected and retrieved.                           - Two 6 to 8 mm polyps in the ascending  colon,                            removed with a cold snare. Resected and retrieved.                           - One 6 mm polyp in the transverse colon, removed                            with a cold snare. Resected and retrieved.                           - Diverticulosis in the sigmoid colon.                           - Internal hemorrhoids. Recommendation:           - Patient has a contact number available for  emergencies. The signs and symptoms of potential                            delayed complications were discussed with the                            patient. Return to normal activities tomorrow.                            Written discharge instructions were provided to the                            patient.                           - Resume previous diet.                           - Continue present medications.                           - Await pathology results.                           - Repeat colonoscopy may be recommended for                            surveillance. The colonoscopy date will be                            determined after pathology results from today's                            exam become available for review. [name], MD 12/14/2021 2:05:15 PM This report has been signed electronically.

## 2021-12-14 NOTE — Progress Notes (Signed)
Nad vss trans to pacu 

## 2021-12-14 NOTE — Patient Instructions (Signed)
YOU HAD AN ENDOSCOPIC PROCEDURE TODAY AT [facility]:   Refer to the procedure report that was given to you for any specific questions about what was found during the examination.  If the procedure report does not answer your questions, please call your gastroenterologist to clarify.  If you requested that your care partner not be given the details of your procedure findings, then the procedure report has been included in a sealed envelope for you to review at your convenience later.  YOU SHOULD EXPECT: Some feelings of bloating in the abdomen. Passage of more gas than usual.  Walking can help get rid of the air that was put into your GI tract during the procedure and reduce the bloating. If you had a lower endoscopy (such as a colonoscopy or flexible sigmoidoscopy) you may notice spotting of blood in your stool or on the toilet paper. If you underwent a bowel prep for your procedure, you may not have a normal bowel movement for a few days.  Please Note:  You might notice some irritation and congestion in your nose or some drainage.  This is from the oxygen used during your procedure.  There is no need for concern and it should clear up in a day or so.  SYMPTOMS TO REPORT IMMEDIATELY:  Following lower endoscopy (colonoscopy or flexible sigmoidoscopy):  Excessive amounts of blood in the stool  Significant tenderness or worsening of abdominal pains  Swelling of the abdomen that is new, acute  Fever of 100F or higher  Following upper endoscopy (EGD)  Vomiting of blood or coffee ground material  New chest pain or pain under the shoulder blades  Painful or persistently difficult swallowing  New shortness of breath  Fever of 100F or higher  Black, tarry-looking stools  For urgent or emergent issues, a gastroenterologist can be reached at any hour by calling [phone]. Do not use MyChart messaging for urgent concerns.    DIET:  We do recommend a small meal at first, but  then you may proceed to your regular diet.  Drink plenty of fluids but you should avoid alcoholic beverages for 24 hours.  ACTIVITY:  You should plan to take it easy for the rest of today and you should NOT DRIVE or use heavy machinery until tomorrow (because of the sedation medicines used during the test).    FOLLOW UP: Our staff will call the number listed on your records 24-72 hours following your procedure to check on you and address any questions or concerns that you may have regarding the information given to you following your procedure. If we do not reach you, we will leave a message.  We will attempt to reach you two times.  During this call, we will ask if you have developed any symptoms of COVID 19. If you develop any symptoms (ie: fever, flu-like symptoms, shortness of breath, cough etc.) before then, please call [phone].  If you test positive for Covid 19 in the 2 weeks post procedure, please call and report this information to [place].    If any biopsies were taken you will be contacted by phone or by letter within the next 1-3 weeks.  Please call us at [phone] if you have not heard about the biopsies in 3 weeks.    SIGNATURES/CONFIDENTIALITY: You and/or your care partner have signed paperwork which will be entered into your electronic medical record.  These signatures attest to the fact that that the information above on your After  Visit Summary has been reviewed and is understood.  Full responsibility of the confidentiality of this discharge information lies with you and/or your care-partner.

## 2021-12-14 NOTE — Progress Notes (Signed)
GASTROENTEROLOGY PROCEDURE H&P NOTE   Primary Care Physician: [facility]    Reason for Procedure:  Positive Cologuard test  Plan:    Colonoscopy  Patient is appropriate for endoscopic procedure(s) in the ambulatory ([place]) setting.  The nature of the procedure, as well as the risks, benefits, and alternatives were carefully and thoroughly reviewed with the patient. Ample time for discussion and questions allowed. The patient understood, was satisfied, and agreed to proceed.     HPI: Larry Kennedy is a 76 y.o. male who presents for colonoscopy.  Medical history as below.  Tolerated the prep.  No recent chest pain or shortness of breath.  No abdominal pain today.  Past Medical History:  Diagnosis Date   Enlarged prostate    Hypertension     Past Surgical History:  Procedure Laterality Date   DG ELBOW RIGHT COMPLETE ([place] HX)     40 yrs ago   ESOPHAGOGASTRODUODENOSCOPY N/A 02/17/2017   Procedure: ESOPHAGOGASTRODUODENOSCOPY (EGD);  Surgeon: [name], MD;  Location: [facility] ENDOSCOPY;  Service: Gastroenterology;  Laterality: N/A;   JOINT REPLACEMENT     KNEE SURGERY     LUMBAR LAMINECTOMY/DECOMPRESSION MICRODISCECTOMY N/A 02/09/2017   Procedure: Lumbar decompression L2-S1 with L5-S1 disectomy ;  Surgeon: [name], MD;  Location: [place];  Service: Orthopedics;  Laterality: N/A;  3 h rs   TONSILLECTOMY      Prior to Admission medications   Medication Sig Start Date End Date Taking? Authorizing Provider  amLODipine (NORVASC) 5 MG tablet Take 5 mg by mouth daily. 01/03/17  Yes [provider]  aspirin EC 81 MG tablet Take 81 mg by mouth daily.   Yes [provider]  atorvastatin (LIPITOR) 40 MG tablet Take 40 mg by mouth every evening.  11/15/13  Yes [provider]  carvedilol (COREG) 12.5 MG tablet Take 18.75 mg by mouth 2 (two) times daily. 01/03/17  Yes [provider]  Cod Liver Oil CAPS Take 2-3 capsules by  mouth 2 (two) times daily.    Yes [provider]  cycloSPORINE (RESTASIS) 0.05 % ophthalmic emulsion Place 1 drop into both eyes 2 (two) times daily.   Yes [provider]  Flaxseed, Linseed, (FLAX SEED OIL PO) Take 1 capsule by mouth 2 (two) times daily.    Yes [provider]  fluticasone (FLONASE) 50 MCG/ACT nasal spray Place 1 spray into both nostrils daily as needed for allergies. 11/02/16  Yes [provider]  saw palmetto 160 MG capsule Take 160 mg by mouth 2 (two) times daily.   Yes [provider]  tamsulosin (FLOMAX) 0.4 MG CAPS capsule Take 0.4 mg by mouth every evening. 12/07/16  Yes [provider]  methocarbamol (ROBAXIN) 500 MG tablet Take 1 tablet (500 mg total) by mouth 3 (three) times daily. Patient not taking: Reported on 12/14/2021 02/09/17   Mayo, [name], PA-C  omeprazole (PRILOSEC) 20 MG capsule TAKE 1 CAPSULE BY MOUTH  DAILY Patient not taking: Reported on 12/14/2021 04/11/18   [name], MD    Current Outpatient Medications  Medication Sig Dispense Refill   amLODipine (NORVASC) 5 MG tablet Take 5 mg by mouth daily.     aspirin EC 81 MG tablet Take 81 mg by mouth daily.     atorvastatin (LIPITOR) 40 MG tablet Take 40 mg by mouth every evening.      carvedilol (COREG) 12.5 MG tablet Take 18.75 mg by mouth 2 (two) times daily.  Cod Liver Oil CAPS Take 2-3 capsules by mouth 2 (two) times daily.      cycloSPORINE (RESTASIS) 0.05 % ophthalmic emulsion Place 1 drop into both eyes 2 (two) times daily.     Flaxseed, Linseed, (FLAX SEED OIL PO) Take 1 capsule by mouth 2 (two) times daily.      fluticasone (FLONASE) 50 MCG/ACT nasal spray Place 1 spray into both nostrils daily as needed for allergies.     saw palmetto 160 MG capsule Take 160 mg by mouth 2 (two) times daily.     tamsulosin (FLOMAX) 0.4 MG CAPS capsule Take 0.4 mg by mouth every evening.     methocarbamol (ROBAXIN) 500 MG tablet Take 1 tablet (500 mg  total) by mouth 3 (three) times daily. (Patient not taking: Reported on 12/14/2021) 30 tablet 0   omeprazole (PRILOSEC) 20 MG capsule TAKE 1 CAPSULE BY MOUTH  DAILY (Patient not taking: Reported on 12/14/2021) 90 capsule 0   Current Facility-Administered Medications  Medication Dose Route Frequency Provider Last Rate Last Admin   0.9 %  sodium chloride infusion  500 mL Intravenous Once Chaniya Genter, Lajuan Lines, MD        Allergies as of 12/14/2021 - Review Complete 12/14/2021  Allergen Reaction Noted   Hydrocodone-acetaminophen Anxiety 12/30/2013    Family History  Problem Relation Age of Onset   Esophageal cancer Neg Hx    Pancreatic cancer Neg Hx    Colon cancer Neg Hx    Stomach cancer Neg Hx     Social History   Socioeconomic History   Marital status: Married    Spouse name: Not on file   Number of children: Not on file   Years of education: Not on file   Highest education level: Not on file  Occupational History   Not on file  Tobacco Use   Smoking status: Former    Types: Cigarettes    Quit date: 07/05/1973    Years since quitting: 48.4   Smokeless tobacco: Never  Vaping Use   Vaping Use: Never used  Substance and Sexual Activity   Alcohol use: No   Drug use: No   Sexual activity: Not on file  Other Topics Concern   Not on file  Social History Narrative   Not on file   Social Determinants of Health   Financial Resource Strain: Not on file  Food Insecurity: Not on file  Transportation Needs: Not on file  Physical Activity: Not on file  Stress: Not on file  Social Connections: Not on file  Intimate Partner Violence: Not on file    Physical Exam: Vital signs in last 24 hours: '@BP'$  133/74   Pulse 80   Temp 98.1 F (36.7 C)   Ht '5\' 10"'$  (1.778 m)   Wt 241 lb (109.3 kg)   SpO2 96%   BMI 34.58 kg/m  GEN: NAD EYE: Sclerae anicteric ENT: MMM CV: Non-tachycardic Pulm: CTA b/l GI: Soft, NT/ND NEURO:  Alert & Oriented x 3   [name], MD [place]  [facility]  12/14/2021 1:37 PM

## 2021-12-15 ENCOUNTER — Telehealth: Payer: Self-pay | Admitting: *Deleted

## 2021-12-15 NOTE — Telephone Encounter (Signed)
  Follow up Call-     12/14/2021   12:36 PM  Call back number  Post procedure Call Back phone  # [phone]  Permission to leave phone message Yes     Patient questions:  Do you have a fever, pain , or abdominal swelling? No. Pain Score  0 *  Have you tolerated food without any problems? Yes.    Have you been able to return to your normal activities? Yes.    Do you have any questions about your discharge instructions: Diet   No. Medications  No. Follow up visit  No.  Do you have questions or concerns about your Care? No.  Actions: * If pain score is 4 or above: No action needed, pain <4.

## 2021-12-25 ENCOUNTER — Encounter: Payer: Self-pay | Admitting: Internal Medicine

## 2024-06-27 ENCOUNTER — Other Ambulatory Visit (HOSPITAL_COMMUNITY): Payer: Self-pay

## 2024-06-29 ENCOUNTER — Other Ambulatory Visit (HOSPITAL_BASED_OUTPATIENT_CLINIC_OR_DEPARTMENT_OTHER): Payer: Self-pay

## 2024-06-29 ENCOUNTER — Other Ambulatory Visit (HOSPITAL_COMMUNITY): Payer: Self-pay

## 2024-06-29 MED ORDER — MIRABEGRON ER 50 MG PO TB24
50.0000 mg | ORAL_TABLET | Freq: Every day | ORAL | 3 refills | Status: AC
  Filled 2024-06-29: qty 30, 30d supply, fill #0
  Filled 2024-07-31 – 2024-08-09 (×2): qty 30, 30d supply, fill #1

## 2024-06-29 MED ORDER — METFORMIN HCL ER 500 MG PO TB24
1000.0000 mg | ORAL_TABLET | Freq: Every day | ORAL | 1 refills | Status: AC
  Filled 2024-06-29: qty 200, 100d supply, fill #0

## 2024-06-29 MED ORDER — AMLODIPINE BESYLATE 5 MG PO TABS
5.0000 mg | ORAL_TABLET | Freq: Every day | ORAL | 3 refills | Status: AC
  Filled 2024-06-29: qty 100, 100d supply, fill #0

## 2024-06-29 MED ORDER — OMEPRAZOLE 20 MG PO CPDR
20.0000 mg | DELAYED_RELEASE_CAPSULE | Freq: Every day | ORAL | 1 refills | Status: AC
  Filled 2024-06-29: qty 100, 100d supply, fill #0

## 2024-06-29 MED ORDER — CARVEDILOL 12.5 MG PO TABS
ORAL_TABLET | ORAL | 3 refills | Status: AC
  Filled 2024-06-29: qty 300, 100d supply, fill #0

## 2024-06-29 MED ORDER — TAMSULOSIN HCL 0.4 MG PO CAPS
0.4000 mg | ORAL_CAPSULE | Freq: Every day | ORAL | 2 refills | Status: AC
  Filled 2024-06-29: qty 100, 100d supply, fill #0

## 2024-06-29 MED ORDER — ATORVASTATIN CALCIUM 40 MG PO TABS
40.0000 mg | ORAL_TABLET | Freq: Every day | ORAL | 3 refills | Status: AC
  Filled 2024-06-29 – 2024-08-10 (×2): qty 100, 100d supply, fill #0

## 2024-07-02 ENCOUNTER — Other Ambulatory Visit (HOSPITAL_COMMUNITY): Payer: Self-pay

## 2024-07-04 ENCOUNTER — Other Ambulatory Visit: Payer: Self-pay

## 2024-07-31 ENCOUNTER — Other Ambulatory Visit (HOSPITAL_COMMUNITY): Payer: Self-pay

## 2024-08-01 ENCOUNTER — Encounter: Payer: Self-pay | Admitting: Pharmacist

## 2024-08-01 ENCOUNTER — Other Ambulatory Visit: Payer: Self-pay

## 2024-08-02 ENCOUNTER — Other Ambulatory Visit: Payer: Self-pay

## 2024-08-09 ENCOUNTER — Other Ambulatory Visit (HOSPITAL_COMMUNITY): Payer: Self-pay

## 2024-08-10 ENCOUNTER — Other Ambulatory Visit (HOSPITAL_COMMUNITY): Payer: Self-pay

## 2024-08-10 ENCOUNTER — Other Ambulatory Visit: Payer: Self-pay
